# Patient Record
Sex: Female | Born: 1990 | Race: White | Hispanic: No | Marital: Single | Smoking: Former smoker
Health system: Southern US, Community
[De-identification: ages and names within clinical notes are randomized; demographics above are authoritative.]

## PROBLEM LIST (undated history)

## (undated) DIAGNOSIS — F419 Anxiety disorder, unspecified: Secondary | ICD-10-CM

## (undated) DIAGNOSIS — A6920 Lyme disease, unspecified: Secondary | ICD-10-CM

## (undated) DIAGNOSIS — F329 Major depressive disorder, single episode, unspecified: Secondary | ICD-10-CM

## (undated) DIAGNOSIS — F32A Depression, unspecified: Secondary | ICD-10-CM

## (undated) HISTORY — DX: Depression, unspecified: F32.A

## (undated) HISTORY — DX: Anxiety disorder, unspecified: F41.9

## (undated) HISTORY — PX: WISDOM TOOTH EXTRACTION: SHX21

## (undated) HISTORY — DX: Major depressive disorder, single episode, unspecified: F32.9

---

## 2010-09-01 ENCOUNTER — Emergency Department: Payer: Self-pay | Admitting: Emergency Medicine

## 2011-01-15 ENCOUNTER — Ambulatory Visit (INDEPENDENT_AMBULATORY_CARE_PROVIDER_SITE_OTHER): Payer: Medicaid Other | Admitting: Family Medicine

## 2011-01-15 ENCOUNTER — Encounter: Payer: Self-pay | Admitting: Gynecology

## 2011-01-15 VITALS — BP 93/54 | Wt 91.0 lb

## 2011-01-15 DIAGNOSIS — Z3480 Encounter for supervision of other normal pregnancy, unspecified trimester: Secondary | ICD-10-CM

## 2011-01-15 DIAGNOSIS — Z762 Encounter for health supervision and care of other healthy infant and child: Secondary | ICD-10-CM

## 2011-01-16 LAB — OBSTETRIC PANEL
Antibody Screen: NEGATIVE
Basophils Absolute: 0 10*3/uL (ref 0.0–0.1)
Basophils Relative: 1 % (ref 0–1)
Eosinophils Absolute: 0.1 10*3/uL (ref 0.0–0.7)
Eosinophils Relative: 1 % (ref 0–5)
HCT: 39.9 % (ref 36.0–46.0)
Hemoglobin: 13 g/dL (ref 12.0–15.0)
Hepatitis B Surface Ag: NEGATIVE
Lymphocytes Relative: 25 % (ref 12–46)
Lymphs Abs: 2 10*3/uL (ref 0.7–4.0)
MCH: 30.2 pg (ref 26.0–34.0)
MCHC: 32.6 g/dL (ref 30.0–36.0)
MCV: 92.8 fL (ref 78.0–100.0)
Monocytes Absolute: 0.5 10*3/uL (ref 0.1–1.0)
Monocytes Relative: 7 % (ref 3–12)
Neutro Abs: 5.4 10*3/uL (ref 1.7–7.7)
Neutrophils Relative %: 67 % (ref 43–77)
Platelets: 325 10*3/uL (ref 150–400)
RBC: 4.3 MIL/uL (ref 3.87–5.11)
RDW: 13.6 % (ref 11.5–15.5)
Rh Type: POSITIVE
Rubella: >500 IU/mL — ABNORMAL HIGH
WBC: 8.1 10*3/uL (ref 4.0–10.5)

## 2011-01-16 LAB — GC/CHLAMYDIA PROBE AMP, URINE
Chlamydia, Swab/Urine, PCR: NEGATIVE
GC Probe Amp, Urine: NEGATIVE

## 2011-01-16 LAB — HEPATITIS B SURFACE ANTIBODY,QUALITATIVE: Hep B S Ab: POSITIVE — AB

## 2011-01-17 LAB — URINE CULTURE
Colony Count: NO GROWTH
Organism ID, Bacteria: NO GROWTH

## 2011-01-30 ENCOUNTER — Ambulatory Visit (HOSPITAL_COMMUNITY)
Admission: RE | Admit: 2011-01-30 | Discharge: 2011-01-30 | Disposition: A | Discharge status: Home / Self Care | Payer: Medicaid Other | Source: Ambulatory Visit | Attending: Obstetrics and Gynecology | Admitting: Obstetrics and Gynecology

## 2011-01-30 DIAGNOSIS — Z3689 Encounter for other specified antenatal screening: Secondary | ICD-10-CM | POA: Insufficient documentation

## 2011-01-30 DIAGNOSIS — Z762 Encounter for health supervision and care of other healthy infant and child: Secondary | ICD-10-CM

## 2011-01-30 DIAGNOSIS — O99891 Other specified diseases and conditions complicating pregnancy: Secondary | ICD-10-CM | POA: Insufficient documentation

## 2011-01-30 DIAGNOSIS — R109 Unspecified abdominal pain: Secondary | ICD-10-CM | POA: Insufficient documentation

## 2011-02-26 ENCOUNTER — Encounter: Payer: Self-pay | Admitting: Obstetrics & Gynecology

## 2011-02-26 DIAGNOSIS — Z3480 Encounter for supervision of other normal pregnancy, unspecified trimester: Secondary | ICD-10-CM

## 2011-03-20 ENCOUNTER — Emergency Department: Payer: Self-pay | Admitting: *Deleted

## 2011-03-30 ENCOUNTER — Emergency Department: Payer: Self-pay

## 2011-09-01 ENCOUNTER — Observation Stay: Payer: Self-pay

## 2011-09-01 LAB — URINALYSIS, COMPLETE
Bacteria: NONE SEEN
Bilirubin,UR: NEGATIVE
Blood: NEGATIVE
Glucose,UR: 100 mg/dL (ref 0–75)
Ketone: NEGATIVE
Leukocyte Esterase: NEGATIVE
Nitrite: NEGATIVE
Ph: 7 (ref 4.5–8.0)
Protein: NEGATIVE
RBC,UR: NONE SEEN /HPF (ref 0–5)
Specific Gravity: 1.005 (ref 1.003–1.030)
Squamous Epithelial: <1
WBC UR: <1 /HPF (ref 0–5)

## 2011-09-13 ENCOUNTER — Inpatient Hospital Stay: Payer: Self-pay | Admitting: Obstetrics and Gynecology

## 2011-09-13 LAB — CBC WITH DIFFERENTIAL/PLATELET
Basophil #: 0.1 10*3/uL (ref 0.0–0.1)
Basophil %: 0.4 %
Eosinophil #: 0 10*3/uL (ref 0.0–0.7)
Eosinophil %: 0.3 %
HCT: 34.1 % — ABNORMAL LOW (ref 35.0–47.0)
HGB: 11.2 g/dL — ABNORMAL LOW (ref 12.0–16.0)
Lymphocyte #: 1.9 10*3/uL (ref 1.0–3.6)
Lymphocyte %: 11.5 %
MCH: 27.6 pg (ref 26.0–34.0)
MCHC: 32.9 g/dL (ref 32.0–36.0)
MCV: 84 fL (ref 80–100)
Monocyte #: 0.8 x10 3/mm (ref 0.2–0.9)
Monocyte %: 5.2 %
Neutrophil #: 13.3 10*3/uL — ABNORMAL HIGH (ref 1.4–6.5)
Neutrophil %: 82.6 %
Platelet: 240 10*3/uL (ref 150–440)
RBC: 4.06 10*6/uL (ref 3.80–5.20)
RDW: 16.6 % — ABNORMAL HIGH (ref 11.5–14.5)
WBC: 16.1 10*3/uL — ABNORMAL HIGH (ref 3.6–11.0)

## 2011-09-15 LAB — HEMATOCRIT: HCT: 33.4 % — ABNORMAL LOW (ref 35.0–47.0)

## 2014-03-08 ENCOUNTER — Encounter: Payer: Self-pay | Admitting: Gynecology

## 2014-09-14 NOTE — H&P (Signed)
L&D Evaluation:  History:   HPI 24 year old [id number] presents to L&D at 38 weeks 4 days with c/o cramping/back pain. EDD 09/11/11, [facility] and normal anatomy scan.  No problems during pregnancy so far.  Pt main complaints are increased pressure "down low" in her abd, some back discomfort, and having trouble voiding due to pressure from baby. Good FM, no VB, no LOF.    Presents with abdominal pain    Patient's Medical History No Chronic Illness    Patient's Surgical History none    Medications Pre Natal Vitamins    Allergies NKDA    Social History none    Family History Non-Contributory   ROS:   ROS see above   Exam:   Vital Signs stable    Urine Protein not completed    General no apparent distress    Mental Status clear    Chest clear    Abdomen gravid, non-tender    Estimated Fetal Weight Average for gestational age    Edema no edema    Pelvic no external lesions, 1cm/thick per RN    Mebranes Intact    FHT normal rate with no decels    Ucx absent    Skin dry   Impression:   Impression reactive NST, other, 38 4/7 week IUP, normal discomforts of pregnancy (abd cramping/back pain)   Plan:   Plan UA, EFM/NST, monitor contractions and for cervical change, discharge    Comments UA negative, follow up in office as scheduled labor prec given. NST reactive   Electronic Signatures: Shella Maximutnam, Dario Yono (CNM)  (Signed 27-Apr-13 15:37)  Authored: L&D Evaluation   Last Updated: 27-Apr-13 15:37 by Shella MaximPutnam, Latoia Eyster (CNM)

## 2016-05-15 ENCOUNTER — Emergency Department (HOSPITAL_COMMUNITY): Payer: PRIVATE HEALTH INSURANCE

## 2016-05-15 ENCOUNTER — Encounter (HOSPITAL_COMMUNITY): Payer: Self-pay | Admitting: Emergency Medicine

## 2016-05-15 ENCOUNTER — Emergency Department (HOSPITAL_COMMUNITY)
Admission: EM | Admit: 2016-05-15 | Discharge: 2016-05-15 | Disposition: A | Discharge status: Home / Self Care | Payer: PRIVATE HEALTH INSURANCE | Attending: Emergency Medicine | Admitting: Emergency Medicine

## 2016-05-15 DIAGNOSIS — S4991XA Unspecified injury of right shoulder and upper arm, initial encounter: Secondary | ICD-10-CM | POA: Diagnosis present

## 2016-05-15 DIAGNOSIS — Y990 Civilian activity done for income or pay: Secondary | ICD-10-CM | POA: Diagnosis not present

## 2016-05-15 DIAGNOSIS — X500XXA Overexertion from strenuous movement or load, initial encounter: Secondary | ICD-10-CM | POA: Diagnosis not present

## 2016-05-15 DIAGNOSIS — Z87891 Personal history of nicotine dependence: Secondary | ICD-10-CM | POA: Diagnosis not present

## 2016-05-15 DIAGNOSIS — Y929 Unspecified place or not applicable: Secondary | ICD-10-CM | POA: Insufficient documentation

## 2016-05-15 DIAGNOSIS — S43401A Unspecified sprain of right shoulder joint, initial encounter: Secondary | ICD-10-CM | POA: Diagnosis not present

## 2016-05-15 DIAGNOSIS — Y9389 Activity, other specified: Secondary | ICD-10-CM | POA: Insufficient documentation

## 2016-05-15 MED ORDER — OXYCODONE-ACETAMINOPHEN 5-325 MG PO TABS
1.0000 | ORAL_TABLET | Freq: Once | ORAL | Status: AC
  Administered 2016-05-15: 1 via ORAL

## 2016-05-15 MED ORDER — LIDOCAINE 4 % EX CREA: 1.0000 "application " | TOPICAL_CREAM | Freq: Three times a day (TID) | CUTANEOUS | 0 refills | Status: DC | PRN

## 2016-05-15 MED ORDER — METHOCARBAMOL 500 MG PO TABS: ORAL_TABLET | ORAL | 0 refills | Status: DC

## 2016-05-15 MED ORDER — OXYCODONE-ACETAMINOPHEN 5-325 MG PO TABS
ORAL_TABLET | ORAL | Status: DC
Start: 2016-05-15 — End: 2016-05-15
  Filled 2016-05-15: qty 1

## 2016-05-15 NOTE — ED Provider Notes (Signed)
[facility]-EMERGENCY DEPT Provider Note   CSN: [id number] Arrival date & time: 05/15/16  0444     History   Chief Complaint Chief Complaint  Patient presents with  . Shoulder Pain     HPI   Blood pressure 117/72, pulse 86, temperature 98.4 F (36.9 C), temperature source Oral, resp. rate 18, height 4\' 11"  (1.499 m), weight 48.5 kg, SpO2 100 %, unknown if currently breastfeeding.  Sue Shepherd is a 26 y.o. female complaining of right posterior shoulder pain radiating down the back after she was lifting a bag of laundry over her head. Patient is left-hand dominant, there was no direct trauma. She denies weakness, numbness. She states that her pain is exacerbated by movement and palpation.  History reviewed. No pertinent past medical history.  There are no active problems to display for this patient.   History reviewed. No pertinent surgical history.  OB History    Gravida Para Term Preterm AB Living   2       1     SAB TAB Ectopic Multiple Live Births   1               Home Medications    Prior to Admission medications   Medication Sig Start Date End Date Taking? Authorizing Provider  lidocaine (LMX) 4 % cream Apply 1 application topically 3 (three) times daily as needed. 05/15/16   Dianelys Scinto, PA-C  methocarbamol (ROBAXIN) 500 MG tablet Can take up to 1-2 tabs every 6 hours PRN PAIN 05/15/16   Wynetta Emery, PA-C  Prenatal Vit-Fe Fumarate-FA (PRENATAL MULTIVITAMIN) 60-1 MG tablet Take 1 tablet by mouth daily.      Historical Provider, MD    Family History Family History  Problem Relation Age of Onset  . Cancer Paternal Aunt     lung  . Arthritis Paternal Grandfather   . Depression Paternal Grandfather   . Anxiety disorder Paternal Grandfather     Social History Social History  Substance Use Topics  . Smoking status: Former Smoker    Types: Cigarettes    Quit date: 01/05/2011  . Smokeless tobacco: Never Used  . Alcohol use No     Allergies   Patient  has no known allergies.   Review of Systems Review of Systems  10 systems reviewed and found to be negative, except as noted in the HPI.  Physical Exam Updated Vital Signs BP 117/72 (BP Location: Left Arm)   Pulse 86   Temp 98.4 F (36.9 C) (Oral)   Resp 18   Ht 4\' 11"  (1.499 m)   Wt 48.5 kg   SpO2 100%   Breastfeeding? Unknown   BMI 21.61 kg/m   Physical Exam  Constitutional: She is oriented to person, place, and time. She appears well-developed and well-nourished. No distress.  HENT:  Head: Normocephalic and atraumatic.  Mouth/Throat: Oropharynx is clear and moist.  Eyes: Conjunctivae and EOM are normal. Pupils are equal, round, and reactive to light.  Neck: Normal range of motion.  Cardiovascular: Normal rate, regular rhythm and intact distal pulses.   Pulmonary/Chest: Effort normal and breath sounds normal.  Abdominal: Soft. There is no tenderness.  Musculoskeletal: She exhibits tenderness.       Back:  Pain with abduction of left shoulder however she can abduct greater than 90, drop arm is negative, focally tender along the trapezius as diagrammed. No focal tenderness along the rotator cuff musculature. Distally neurovascularly intact.  Neurological: She is alert and oriented to person, place, and  time.  Skin: She is not diaphoretic.  Psychiatric: She has a normal mood and affect.  Nursing note and vitals reviewed.    ED Treatments / Results  Labs (all labs ordered are listed, but only abnormal results are displayed) Labs Reviewed - No data to display  EKG  EKG Interpretation None       Radiology Dg Shoulder Right  Result Date: 05/15/2016 CLINICAL DATA:  Right shoulder pain when turning the head to the right. Injury tonight at work while handling heavy laundry bag. EXAM: RIGHT SHOULDER - 2+ VIEW COMPARISON:  None. FINDINGS: There is no evidence of fracture or dislocation. There is no evidence of arthropathy or other focal bone abnormality. Soft tissues  are unremarkable. IMPRESSION: Negative. Electronically Signed   By: Burman NievesWilliam  Stevens M.D.   On: 05/15/2016 05:24    Procedures Procedures (including critical care time)  Medications Ordered in ED Medications  oxyCODONE-acetaminophen (PERCOCET/ROXICET) 5-325 MG per tablet 1 tablet (1 tablet Oral Given 05/15/16 0503)     Initial Impression / Assessment and Plan / ED Course  I have reviewed the triage vital signs and the nursing notes.  Pertinent labs & imaging results that were available during my care of the patient were reviewed by me and considered in my medical decision making (see chart for details).  Clinical Course     Vitals:   05/15/16 0456 05/15/16 0457  BP: 117/72   Pulse: 86   Resp: 18   Temp: 98.4 F (36.9 C)   TempSrc: Oral   SpO2: 100%   Weight:  48.5 kg  Height:  4\' 11"  (1.499 m)    Medications  oxyCODONE-acetaminophen (PERCOCET/ROXICET) 5-325 MG per tablet 1 tablet (1 tablet Oral Given 05/15/16 0503)    Sue Shepherd is 26 y.o. female presenting with Pain to left trapezius after lifting a heavy bag of soiled linens over her head. Good range of motion, she's not focally tender along the rotator cuff. Because her drop arm is normal. X-ray negative. Patient is offered Lidoderm ointment high-dose NSAIDs and orthopedic referral is given when necessary.  Evaluation does not show pathology that would require ongoing emergent intervention or inpatient treatment. Pt is hemodynamically stable and mentating appropriately. Discussed findings and plan with patient/guardian, who agrees with care plan. All questions answered. Return precautions discussed and outpatient follow up given.      Final Clinical Impressions(s) / ED Diagnoses   Final diagnoses:  Sprain of right shoulder, unspecified shoulder sprain type, initial encounter    New Prescriptions New Prescriptions   LIDOCAINE (LMX) 4 % CREAM    Apply 1 application topically 3 (three) times daily as needed.    METHOCARBAMOL (ROBAXIN) 500 MG TABLET    Can take up to 1-2 tabs every 6 hours PRN PAIN     Wynetta Emeryicole Shalane Florendo, PA-C 05/15/16 [id number]    Gilda Creasehristopher J Pollina, MD 05/15/16 [phone]

## 2016-05-15 NOTE — Discharge Instructions (Signed)
For pain control you may take up to 800mg of Motrin (also known as ibuprofen). That is usually 4 over the counter pills,  3 times a day. Take with food to minimize stomach irritation  ° °For breakthrough pain you may take Robaxin. Do not drink alcohol, drive or operate heavy machinery when taking Robaxin. ° °

## 2016-05-15 NOTE — ED Triage Notes (Signed)
Patient injured her right shoulder while lifting a heavy linen bag at work this evening , pain at right shoulder worse with movement .

## 2017-06-27 ENCOUNTER — Other Ambulatory Visit: Payer: Self-pay

## 2017-06-27 ENCOUNTER — Emergency Department
Admission: EM | Admit: 2017-06-27 | Discharge: 2017-06-27 | Disposition: A | Discharge status: Home / Self Care | Payer: Self-pay | Attending: Emergency Medicine | Admitting: Emergency Medicine

## 2017-06-27 ENCOUNTER — Emergency Department: Payer: Self-pay

## 2017-06-27 ENCOUNTER — Encounter: Payer: Self-pay | Admitting: Emergency Medicine

## 2017-06-27 DIAGNOSIS — Z3202 Encounter for pregnancy test, result negative: Secondary | ICD-10-CM | POA: Insufficient documentation

## 2017-06-27 DIAGNOSIS — F17210 Nicotine dependence, cigarettes, uncomplicated: Secondary | ICD-10-CM | POA: Insufficient documentation

## 2017-06-27 DIAGNOSIS — R1012 Left upper quadrant pain: Secondary | ICD-10-CM

## 2017-06-27 DIAGNOSIS — K5901 Slow transit constipation: Secondary | ICD-10-CM | POA: Insufficient documentation

## 2017-06-27 LAB — COMPREHENSIVE METABOLIC PANEL
ALT: 23 U/L (ref 14–54)
AST: 28 U/L (ref 15–41)
Albumin: 4.6 g/dL (ref 3.5–5.0)
Alkaline Phosphatase: 75 U/L (ref 38–126)
Anion gap: 10 (ref 5–15)
BUN: 10 mg/dL (ref 6–20)
CO2: 21 mmol/L — ABNORMAL LOW (ref 22–32)
Calcium: 9.3 mg/dL (ref 8.9–10.3)
Chloride: 106 mmol/L (ref 101–111)
Creatinine, Ser: 0.78 mg/dL (ref 0.44–1.00)
GFR calc Af Amer: >60 mL/min (ref >60)
GFR calc non Af Amer: >60 mL/min (ref >60)
Glucose, Bld: 101 mg/dL — ABNORMAL HIGH (ref 65–99)
Potassium: 3.7 mmol/L (ref 3.5–5.1)
Sodium: 137 mmol/L (ref 135–145)
Total Bilirubin: 1.6 mg/dL — ABNORMAL HIGH (ref 0.3–1.2)
Total Protein: 7.9 g/dL (ref 6.5–8.1)

## 2017-06-27 LAB — CBC
HCT: 45.5 % (ref 35.0–47.0)
Hemoglobin: 15.5 g/dL (ref 12.0–16.0)
MCH: 32.2 pg (ref 26.0–34.0)
MCHC: 34.1 g/dL (ref 32.0–36.0)
MCV: 94.4 fL (ref 80.0–100.0)
Platelets: 304 10*3/uL (ref 150–440)
RBC: 4.82 MIL/uL (ref 3.80–5.20)
RDW: 13.4 % (ref 11.5–14.5)
WBC: 8.3 10*3/uL (ref 3.6–11.0)

## 2017-06-27 LAB — POCT PREGNANCY, URINE: Preg Test, Ur: NEGATIVE

## 2017-06-27 LAB — URINALYSIS, COMPLETE (UACMP) WITH MICROSCOPIC
Bacteria, UA: NONE SEEN
Bilirubin Urine: NEGATIVE
Glucose, UA: NEGATIVE mg/dL
Hgb urine dipstick: NEGATIVE
Ketones, ur: NEGATIVE mg/dL
Leukocytes, UA: NEGATIVE
Nitrite: NEGATIVE
Protein, ur: NEGATIVE mg/dL
Specific Gravity, Urine: 1.009 (ref 1.005–1.030)
pH: 6 (ref 5.0–8.0)

## 2017-06-27 LAB — LIPASE, BLOOD: Lipase: 24 U/L (ref 11–51)

## 2017-06-27 MED ORDER — POLYETHYLENE GLYCOL 3350 17 G PO PACK: 17.0000 g | PACK | Freq: Every day | ORAL | 0 refills | Status: DC

## 2017-06-27 NOTE — ED Notes (Signed)
Patient ambulatory to lobby with steady gait and NAD noted. Verbalized understanding of discharge instructions and follow-up care.  

## 2017-06-27 NOTE — ED Triage Notes (Signed)
Left ABD pain x 4 days with fever and loose stools. Denies problems with urination.

## 2017-06-27 NOTE — ED Triage Notes (Signed)
First Nurse Note:  C/o left flank pain x 2-3 days.  Patient is AAOx3.  Skin warm and dry. NAD

## 2017-06-27 NOTE — ED Provider Notes (Signed)
[facility] Emergency Department Provider Note   ____________________________________________    I have reviewed the triage vital signs and the nursing notes.   HISTORY  Chief Complaint Abdominal Pain     HPI Sue Shepherd is a 27 y.o. female who presents with complaints of left upper quadrant abdominal pain.  Patient reports approximately 4 days ago she initially developed a fever of 101.5, a day or 2 prior to that she had developed a dry cough.  One day after developing the fever she developed some pain in her left upper quadrant and also developed loose stools.  Occasional nausea but not severe.  No history of abdominal surgery.  Took ibuprofen which helped her fever but not the pain.  She reports the pain is moderate and sharp and constant in the left upper quadrant.  No radiation.  No history of kidney stones.  No dysuria.   No past medical history on file.  There are no active problems to display for this patient.   No past surgical history on file.  Prior to Admission medications   Medication Sig Start Date End Date Taking? Authorizing Provider  lidocaine (LMX) 4 % cream Apply 1 application topically 3 (three) times daily as needed. Patient not taking: Reported on 06/27/2017 05/15/16   Pisciotta, Joni Reining, PA-C  methocarbamol (ROBAXIN) 500 MG tablet Can take up to 1-2 tabs every 6 hours PRN PAIN Patient not taking: Reported on 06/27/2017 05/15/16   Pisciotta, Joni Reining, PA-C  polyethylene glycol (MIRALAX / GLYCOLAX) packet Take 17 g by mouth daily. 06/27/17   Jene Every, MD     Allergies Patient has no known allergies.  Family History  Problem Relation Age of Onset  . Cancer Paternal Aunt        lung  . Arthritis Paternal Grandfather   . Depression Paternal Grandfather   . Anxiety disorder Paternal Grandfather     Social History Social History   Tobacco Use  . Smoking status: Current Every Day Smoker    Types: Cigarettes  . Smokeless  tobacco: Never Used  Substance Use Topics  . Alcohol use: Yes  . Drug use: No    Review of Systems  Constitutional: Fever Eyes: No visual changes.  ENT: No sore throat. Cardiovascular: Denies chest pain. Respiratory: Denies shortness of breath. Gastrointestinal: No abdominal pain.  Diarrhea, occasional nausea Genitourinary: Negative for dysuria. Musculoskeletal: Negative for back pain. Skin: Negative for rash. Neurological: Negative for headaches    ____________________________________________   PHYSICAL EXAM:  VITAL SIGNS: ED Triage Vitals  Enc Vitals Group     BP 06/27/17 0828 106/68     Pulse Rate 06/27/17 0828 83     Resp --      Temp 06/27/17 0828 98.4 F (36.9 C)     Temp Source 06/27/17 0828 Oral     SpO2 06/27/17 0828 98 %     Weight 06/27/17 0830 43.5 kg (96 lb)     Height 06/27/17 0830 1.499 m (4\' 11" )     Head Circumference --      Peak Flow --      Pain Score 06/27/17 0829 8     Pain Loc --      Pain Edu? --      Excl. in GC? --     Constitutional: Alert and oriented. No acute distress. Pleasant and interactive Eyes: Conjunctivae are normal.   Nose: No congestion/rhinnorhea. Mouth/Throat: Mucous membranes are moist.  Pharynx normal Neck:  Painless ROM  Cardiovascular: Normal rate, regular rhythm.   Good peripheral circulation. Respiratory: Normal respiratory effort.  No retractions. Lungs CTAB. Gastrointestinal: Mild tenderness to palpation left upper quadrant.  Unable to palpate spleen. No distention.  No CVA tenderness. Genitourinary: deferred Musculoskeletal:   Warm and well perfused Neurologic:  Normal speech and language. No gross focal neurologic deficits are appreciated.  Skin:  Skin is warm, dry and intact. No rash noted. Psychiatric: Mood and affect are normal. Speech and behavior are normal.  ____________________________________________   LABS (all labs ordered are listed, but only abnormal results are displayed)  Labs Reviewed    URINALYSIS, COMPLETE (UACMP) WITH MICROSCOPIC - Abnormal; Notable for the following components:      Result Value   Color, Urine YELLOW (*)    APPearance HAZY (*)    Squamous Epithelial / LPF 6-30 (*)    All other components within normal limits  COMPREHENSIVE METABOLIC PANEL - Abnormal; Notable for the following components:   CO2 21 (*)    Glucose, Bld 101 (*)    Total Bilirubin 1.6 (*)    All other components within normal limits  CBC  LIPASE, BLOOD  POCT PREGNANCY, URINE   ____________________________________________  EKG  None ____________________________________________  RADIOLOGY  CT scan demonstrates ascending colon constipation ____________________________________________   PROCEDURES  Procedure(s) performed: No  Procedures   Critical Care performed: No ____________________________________________   INITIAL IMPRESSION / ASSESSMENT AND PLAN / ED COURSE  Pertinent labs & imaging results that were available during my care of the patient were reviewed by me and considered in my medical decision making (see chart for details).  Patient overall well-appearing in no acute distress.  Vital signs normal.  Exam demonstrates mild left upper quadrant tenderness.  Differential diagnosis includes colitis, viral illness, less likely gastritis, ureterolithiasis, UTI  We will check labs including urinalysis and reevaluate.  Labs and urinalysis are reassuring.  CT scan demonstrates only constipation, no kidney stones.  Suspect constipation is likely playing a role in her discomfort.  Discussed high-fiber diet, will start on MiraLAX, outpatient follow-up or return to ED if any worsening pain    ____________________________________________   FINAL CLINICAL IMPRESSION(S) / ED DIAGNOSES  Final diagnoses:  Slow transit constipation  Left upper quadrant pain        Note:  This document was prepared using Dragon voice recognition software and may include  unintentional dictation errors.    [name], Daiki Dicostanzo, MD 06/27/17 [phone]

## 2017-07-01 ENCOUNTER — Other Ambulatory Visit: Payer: Self-pay

## 2017-07-01 ENCOUNTER — Emergency Department: Payer: Self-pay

## 2017-07-01 ENCOUNTER — Emergency Department
Admission: EM | Admit: 2017-07-01 | Discharge: 2017-07-01 | Disposition: A | Discharge status: Home / Self Care | Payer: Self-pay | Attending: Student in an Organized Health Care Education/Training Program | Admitting: Student in an Organized Health Care Education/Training Program

## 2017-07-01 ENCOUNTER — Encounter: Payer: Self-pay | Admitting: Emergency Medicine

## 2017-07-01 DIAGNOSIS — Z79899 Other long term (current) drug therapy: Secondary | ICD-10-CM | POA: Insufficient documentation

## 2017-07-01 DIAGNOSIS — R1084 Generalized abdominal pain: Secondary | ICD-10-CM | POA: Insufficient documentation

## 2017-07-01 DIAGNOSIS — F17210 Nicotine dependence, cigarettes, uncomplicated: Secondary | ICD-10-CM | POA: Insufficient documentation

## 2017-07-01 DIAGNOSIS — R197 Diarrhea, unspecified: Secondary | ICD-10-CM | POA: Insufficient documentation

## 2017-07-01 LAB — COMPREHENSIVE METABOLIC PANEL
ALT: 17 U/L (ref 14–54)
AST: 24 U/L (ref 15–41)
Albumin: 4.3 g/dL (ref 3.5–5.0)
Alkaline Phosphatase: 80 U/L (ref 38–126)
Anion gap: 6 (ref 5–15)
BUN: 10 mg/dL (ref 6–20)
CO2: 24 mmol/L (ref 22–32)
Calcium: 8.6 mg/dL — ABNORMAL LOW (ref 8.9–10.3)
Chloride: 104 mmol/L (ref 101–111)
Creatinine, Ser: 0.68 mg/dL (ref 0.44–1.00)
GFR calc Af Amer: >60 mL/min (ref >60)
GFR calc non Af Amer: >60 mL/min (ref >60)
Glucose, Bld: 124 mg/dL — ABNORMAL HIGH (ref 65–99)
Potassium: 3.4 mmol/L — ABNORMAL LOW (ref 3.5–5.1)
Sodium: 134 mmol/L — ABNORMAL LOW (ref 135–145)
Total Bilirubin: 1.4 mg/dL — ABNORMAL HIGH (ref 0.3–1.2)
Total Protein: 7.3 g/dL (ref 6.5–8.1)

## 2017-07-01 LAB — CBC
HCT: 42 % (ref 35.0–47.0)
Hemoglobin: 14.2 g/dL (ref 12.0–16.0)
MCH: 32.3 pg (ref 26.0–34.0)
MCHC: 33.8 g/dL (ref 32.0–36.0)
MCV: 95.4 fL (ref 80.0–100.0)
Platelets: 274 10*3/uL (ref 150–440)
RBC: 4.4 MIL/uL (ref 3.80–5.20)
RDW: 13.3 % (ref 11.5–14.5)
WBC: 11.7 10*3/uL — ABNORMAL HIGH (ref 3.6–11.0)

## 2017-07-01 LAB — URINALYSIS, COMPLETE (UACMP) WITH MICROSCOPIC
Bacteria, UA: NONE SEEN
Bilirubin Urine: NEGATIVE
Glucose, UA: NEGATIVE mg/dL
Ketones, ur: NEGATIVE mg/dL
Leukocytes, UA: NEGATIVE
Nitrite: NEGATIVE
Protein, ur: NEGATIVE mg/dL
Specific Gravity, Urine: 1.021 (ref 1.005–1.030)
pH: 5 (ref 5.0–8.0)

## 2017-07-01 LAB — LIPASE, BLOOD: Lipase: 27 U/L (ref 11–51)

## 2017-07-01 LAB — POCT PREGNANCY, URINE: Preg Test, Ur: NEGATIVE

## 2017-07-01 MED ORDER — PROMETHAZINE HCL 25 MG/ML IJ SOLN
12.5000 mg | Freq: Four times a day (QID) | INTRAMUSCULAR | Status: DC | PRN
  Administered 2017-07-01: 12.5 mg via INTRAVENOUS
  Filled 2017-07-01: qty 1

## 2017-07-01 MED ORDER — MORPHINE SULFATE (PF) 4 MG/ML IV SOLN
4.0000 mg | INTRAVENOUS | Status: DC | PRN
  Administered 2017-07-01: 4 mg via INTRAVENOUS
  Filled 2017-07-01: qty 1

## 2017-07-01 MED ORDER — DICYCLOMINE HCL 10 MG PO CAPS: 10.0000 mg | ORAL_CAPSULE | Freq: Three times a day (TID) | ORAL | 0 refills | Status: DC | PRN

## 2017-07-01 MED ORDER — SODIUM CHLORIDE 0.9 % IV BOLUS (SEPSIS)
1000.0000 mL | Freq: Once | INTRAVENOUS | Status: AC
  Administered 2017-07-01: 1000 mL via INTRAVENOUS

## 2017-07-01 MED ORDER — PROMETHAZINE HCL 12.5 MG PO TABS: 12.5000 mg | ORAL_TABLET | Freq: Four times a day (QID) | ORAL | 0 refills | Status: DC | PRN

## 2017-07-01 MED ORDER — IOPAMIDOL (ISOVUE-300) INJECTION 61%
65.0000 mL | Freq: Once | INTRAVENOUS | Status: AC | PRN
  Administered 2017-07-01: 65 mL via INTRAVENOUS
  Filled 2017-07-01: qty 75

## 2017-07-01 NOTE — Discharge Instructions (Signed)

## 2017-07-01 NOTE — ED Triage Notes (Signed)
Abdominal pain x 1 week. Originally L side, now all over.

## 2017-07-01 NOTE — ED Provider Notes (Signed)
[facility] Emergency Department Provider Note    First MD Initiated Contact with Patient 07/01/17 1325     (approximate)  I have reviewed the triage vital signs and the nursing notes.   HISTORY  Chief Complaint Abdominal Pain    HPI [name] is a 27 y.o. female presents with chief complaint of generalized abdominal pain associated with bloody diarrhea and some nausea.  No measured fevers.  Does have family history of ulcerative colitis and Crohn's.  Patient was seen for similar symptoms roughly 1 week ago but has had worsening symptoms since then.  States the pain is crampy and colicky in nature.  Mild to moderate in severity.  Said decreased oral intake.  Has never had symptoms like this before.  History reviewed. No pertinent past medical history. Family History  Problem Relation Age of Onset  . Cancer Paternal Aunt        lung  . Arthritis Paternal Grandfather   . Depression Paternal Grandfather   . Anxiety disorder Paternal Grandfather    History reviewed. No pertinent surgical history. There are no active problems to display for this patient.     Prior to Admission medications   Medication Sig Start Date End Date Taking? Authorizing Provider  lidocaine (LMX) 4 % cream Apply 1 application topically 3 (three) times daily as needed. Patient not taking: Reported on 06/27/2017 05/15/16   Pisciotta, Joni ReiningNicole, PA-C  methocarbamol (ROBAXIN) 500 MG tablet Can take up to 1-2 tabs every 6 hours PRN PAIN Patient not taking: Reported on 06/27/2017 05/15/16   Pisciotta, Joni ReiningNicole, PA-C  polyethylene glycol (MIRALAX / GLYCOLAX) packet Take 17 g by mouth daily. 06/27/17   [name], Robert, MD    Allergies Patient has no known allergies.    Social History Social History   Tobacco Use  . Smoking status: Current Every Day Smoker    Types: Cigarettes  . Smokeless tobacco: Never Used  Substance Use Topics  . Alcohol use: Yes  . Drug use: No    Review of  Systems Patient denies headaches, rhinorrhea, blurry vision, numbness, shortness of breath, chest pain, edema, cough, abdominal pain, nausea, vomiting, diarrhea, dysuria, fevers, rashes or hallucinations unless otherwise stated above in HPI. ____________________________________________   PHYSICAL EXAM:  VITAL SIGNS: Vitals:   07/01/17 1232  BP: 113/69  Pulse: 85  Resp: 18  Temp: 98.1 F (36.7 C)  SpO2: 100%    Constitutional: Alert and oriented. Well appearing and in no acute distress. Eyes: Conjunctivae are normal.  Head: Atraumatic. Nose: No congestion/rhinnorhea. Mouth/Throat: Mucous membranes are moist.   Neck: No stridor. Painless ROM.  Cardiovascular: Normal rate, regular rhythm. Grossly normal heart sounds.  Good peripheral circulation. Respiratory: Normal respiratory effort.  No retractions. Lungs CTAB. Gastrointestinal: Soft and nontender. No distention. No abdominal bruits. No CVA tenderness. Genitourinary:  Musculoskeletal: No lower extremity tenderness nor edema.  No joint effusions. Neurologic:  Normal speech and language. No gross focal neurologic deficits are appreciated. No facial droop Skin:  Skin is warm, dry and intact. No rash noted. Psychiatric: Mood and affect are normal. Speech and behavior are normal.  ____________________________________________   LABS (all labs ordered are listed, but only abnormal results are displayed)  Results for orders placed or performed during the hospital encounter of 07/01/17 (from the past 24 hour(s))  Urinalysis, Complete w Microscopic     Status: Abnormal   Collection Time: 07/01/17 12:34 PM  Result Value Ref Range   Color, Urine YELLOW (A) YELLOW  APPearance HAZY (A) CLEAR   Specific Gravity, Urine 1.021 1.005 - 1.030   pH 5.0 5.0 - 8.0   Glucose, UA NEGATIVE NEGATIVE mg/dL   Hgb urine dipstick MODERATE (A) NEGATIVE   Bilirubin Urine NEGATIVE NEGATIVE   Ketones, ur NEGATIVE NEGATIVE mg/dL   Protein, ur NEGATIVE  NEGATIVE mg/dL   Nitrite NEGATIVE NEGATIVE   Leukocytes, UA NEGATIVE NEGATIVE   RBC / HPF 0-5 0 - 5 RBC/hpf   WBC, UA 0-5 0 - 5 WBC/hpf   Bacteria, UA NONE SEEN NONE SEEN   Squamous Epithelial / LPF 0-5 (A) NONE SEEN   Mucus PRESENT   Pregnancy, urine POC     Status: None   Collection Time: 07/01/17 12:39 PM  Result Value Ref Range   Preg Test, Ur NEGATIVE NEGATIVE  Lipase, blood     Status: None   Collection Time: 07/01/17 12:42 PM  Result Value Ref Range   Lipase 27 11 - 51 U/L  Comprehensive metabolic panel     Status: Abnormal   Collection Time: 07/01/17 12:42 PM  Result Value Ref Range   Sodium 134 (L) 135 - 145 mmol/L   Potassium 3.4 (L) 3.5 - 5.1 mmol/L   Chloride 104 101 - 111 mmol/L   CO2 24 22 - 32 mmol/L   Glucose, Bld 124 (H) 65 - 99 mg/dL   BUN 10 6 - 20 mg/dL   Creatinine, Ser 1.61 0.44 - 1.00 mg/dL   Calcium 8.6 (L) 8.9 - 10.3 mg/dL   Total Protein 7.3 6.5 - 8.1 g/dL   Albumin 4.3 3.5 - 5.0 g/dL   AST 24 15 - 41 U/L   ALT 17 14 - 54 U/L   Alkaline Phosphatase 80 38 - 126 U/L   Total Bilirubin 1.4 (H) 0.3 - 1.2 mg/dL   GFR calc non Af Amer >60 >60 mL/min   GFR calc Af Amer >60 >60 mL/min   Anion gap 6 5 - 15  CBC     Status: Abnormal   Collection Time: 07/01/17 12:42 PM  Result Value Ref Range   WBC 11.7 (H) 3.6 - 11.0 K/uL   RBC 4.40 3.80 - 5.20 MIL/uL   Hemoglobin 14.2 12.0 - 16.0 g/dL   HCT 09.6 04.5 - 40.9 %   MCV 95.4 80.0 - 100.0 fL   MCH 32.3 26.0 - 34.0 pg   MCHC 33.8 32.0 - 36.0 g/dL   RDW 81.1 91.4 - 78.2 %   Platelets 274 150 - 440 K/uL   ____________________________________________ ____________________________________________  RADIOLOGY  I personally reviewed all radiographic images ordered to evaluate for the above acute complaints and reviewed radiology reports and findings.  These findings were personally discussed with the patient.  Please see medical record for radiology  report.  ____________________________________________   PROCEDURES  Procedure(s) performed:  Procedures    Critical Care performed: no ____________________________________________   INITIAL IMPRESSION / ASSESSMENT AND PLAN / ED COURSE  Pertinent labs & imaging results that were available during my care of the patient were reviewed by me and considered in my medical decision making (see chart for details).  DDX: ibd, appendicitis, colitis, food poisoning, enteritis, gastritis  Denielle Bayard is a 27 y.o. who presents to the ED with dental pain as described above.  Blood work sent for the above differential shows mild leukocytosis.  Given her history and family history of IBD will order CT imaging to evaluate for acute intra-abdominal process.  CT imaging shows no acute abnormality.  Patient was given IV fluids as well as IV pain medication and I emetics.  Symptoms improved and patient tolerating oral hydration.  At this point I do believe patient is appropriate for follow-up as an outpatient.  Have discussed with the patient and available family all diagnostics and treatments performed thus far and all questions were answered to the best of my ability. The patient demonstrates understanding and agreement with plan.       ____________________________________________   FINAL CLINICAL IMPRESSION(S) / ED DIAGNOSES  Final diagnoses:  Generalized abdominal pain      NEW MEDICATIONS STARTED DURING THIS VISIT:  New Prescriptions   No medications on file     Note:  This document was prepared using Dragon voice recognition software and may include unintentional dictation errors.    Willy Eddy, MD 07/01/17 [phone]

## 2017-07-01 NOTE — ED Notes (Signed)
Patient transported to CT 

## 2017-12-03 ENCOUNTER — Encounter: Payer: Self-pay | Admitting: Radiology

## 2017-12-03 ENCOUNTER — Other Ambulatory Visit: Payer: Medicaid Other

## 2017-12-30 NOTE — Progress Notes (Signed)
Last pap 02/10/2012

## 2017-12-31 ENCOUNTER — Ambulatory Visit (INDEPENDENT_AMBULATORY_CARE_PROVIDER_SITE_OTHER): Payer: Medicaid Other | Admitting: Obstetrics and Gynecology

## 2017-12-31 ENCOUNTER — Other Ambulatory Visit (HOSPITAL_COMMUNITY)
Admission: RE | Admit: 2017-12-31 | Discharge: 2017-12-31 | Disposition: A | Discharge status: Home / Self Care | Payer: Medicaid Other | Source: Ambulatory Visit | Attending: Obstetrics and Gynecology | Admitting: Obstetrics and Gynecology

## 2017-12-31 ENCOUNTER — Encounter: Payer: Self-pay | Admitting: Obstetrics and Gynecology

## 2017-12-31 DIAGNOSIS — Z3480 Encounter for supervision of other normal pregnancy, unspecified trimester: Secondary | ICD-10-CM | POA: Insufficient documentation

## 2017-12-31 DIAGNOSIS — Z3A00 Weeks of gestation of pregnancy not specified: Secondary | ICD-10-CM | POA: Diagnosis not present

## 2017-12-31 MED ORDER — DOXYLAMINE-PYRIDOXINE ER 20-20 MG PO TBCR: 1.0000 | EXTENDED_RELEASE_TABLET | Freq: Every day | ORAL | 2 refills | Status: DC

## 2017-12-31 NOTE — Progress Notes (Signed)
New OB Note  12/31/2017   Clinic: [facility] for [facility]  Chief Complaint: NOB  Transfer of Care Patient: no  History of Present Illness: Sue Shepherd is a 27 y.o. G3P1011 @ 9/6 weeks (EDC 3/25, based on Patient's last menstrual period was 10/23/2017 (within days).).  Preg complicated by has Supervision of other normal pregnancy, antepartum on their problem list.   Any events prior to today's visit: no Her periods were: regular, qmonth She was using Depo-Provera when she conceived.  She has Positive signs or symptoms of nausea/vomiting of pregnancy. She has Negative signs or symptoms of miscarriage or preterm labor On any medications around the time she conceived/early pregnancy: No   ROS: A 12-point review of systems was performed and negative, except as stated in the above HPI.  OBGYN History: As per HPI. OB History  Gravida Para Term Preterm AB Living  3 1 1   1 1   SAB TAB Ectopic Multiple Live Births  1       1    # Outcome Date GA Lbr Len/2nd Weight Sex Delivery Anes PTL Lv  3 Current           2 Term 2013   6 lb 11 oz (3.033 kg)  Vag-Spont   LIV  1 SAB 11/03/10 [redacted]w[redacted]d       DEC    Any issues with any prior pregnancies: no Prior children are healthy, doing well, and without any problems or issues: no History of pap smears: Yes. Last pap smear with last child   Past Medical History: Past Medical History:  Diagnosis Date  . Anxiety   . Depression     Past Surgical History: Past Surgical History:  Procedure Laterality Date  . WISDOM TOOTH EXTRACTION      Family History:  Family History  Problem Relation Age of Onset  . Arthritis Paternal Grandfather   . Anxiety disorder Paternal Grandfather   . Heart disease Paternal Grandfather   . Anxiety disorder Sister   . Depression Sister   . Crohn's disease Paternal Uncle   . Diabetes Maternal Grandfather   . Dementia Paternal Grandmother    She denies any history of mental retardation, birth defects or  genetic disorders in her or the FOB's history  Social History:  Social History   Socioeconomic History  . Marital status: Single    Spouse name: Not on file  . Number of children: Not on file  . Years of education: Not on file  . Highest education level: Not on file  Occupational History  . Not on file  Social Needs  . Financial resource strain: Not on file  . Food insecurity:    Worry: Not on file    Inability: Not on file  . Transportation needs:    Medical: Not on file    Non-medical: Not on file  Tobacco Use  . Smoking status: Former Smoker    Types: Cigarettes    Last attempt to quit: 12/04/2017    Years since quitting: 0.0  . Smokeless tobacco: Never Used  Substance and Sexual Activity  . Alcohol use: Not Currently  . Drug use: No  . Sexual activity: Yes    Birth control/protection: None  Lifestyle  . Physical activity:    Days per week: Not on file    Minutes per session: Not on file  . Stress: Not on file  Relationships  . Social connections:    Talks on phone: Not on file  Gets together: Not on file    Attends religious service: Not on file    Active member of club or organization: Not on file    Attends meetings of clubs or organizations: Not on file    Relationship status: Not on file  . Intimate partner violence:    Fear of current or ex partner: Not on file    Emotionally abused: Not on file    Physically abused: Not on file    Forced sexual activity: Not on file  Other Topics Concern  . Not on file  Social History Narrative  . Not on file    Allergy: No Known Allergies  Health Maintenance:  Mammogram Up to Date: not applicable  Current Outpatient Medications: PNV  Physical Exam:   BP 101/66   Pulse 80   Wt 90 lb 6.4 oz (41 kg)   LMP 10/23/2017 (Within Days) Comment: neg preg test 07/01/17  BMI 18.26 kg/m  Body mass index is 18.26 kg/m.    Marland Kitchen. Fundal height: not applicable FHTs: 160s  General appearance: Well nourished, well  developed female in no acute distress.  Neck:  Supple, normal appearance, and no thyromegaly  Cardiovascular: S1, S2 normal, no murmur, rub or gallop, regular rate and rhythm Respiratory:  Clear to auscultation bilateral. Normal respiratory effort Abdomen: positive bowel sounds and no masses, hernias; diffusely non tender to palpation, non distended Breasts: breasts appear normal, no suspicious masses, no skin or nipple changes or axillary nodes, and normal palpation. Neuro/Psych:  Normal mood and affect.  Skin:  Warm and dry.  Lymphatic:  No inguinal lymphadenopathy.   Pelvic exam: is not limited by body habitus EGBUS: within normal limits, Vagina: within normal limits and with no blood in the vault, Cervix: normal appearing cervix without discharge or lesions, closed/long/high, Uterus:  enlarged, c/w 10 week size, and Adnexa:  normal adnexa and no mass, fullness, tenderness  Laboratory: deferred  Imaging:  Bedside u/s: SLIUP, +FM, 9wks 4d, subj normal fluid  Assessment: pt doing well  Plan: 1. Supervision of other normal pregnancy, antepartum Routine care. Will get labs done next visit so can do cffdna Follow weight closely. bonjesta Rx given - Culture, OB Urine - Cytology - PAP  Problem list reviewed and updated.  >50% of 25 min visit spent on counseling and coordination of care.     Sue Shepherd, Jr. MD Attending Center [facility](Faculty Practice)

## 2017-12-31 NOTE — Patient Instructions (Signed)
Today you are 9 weeks and 6 days Your due date is March 25th

## 2018-01-01 LAB — CYTOLOGY - PAP
Chlamydia: NEGATIVE
Diagnosis: NEGATIVE
Neisseria Gonorrhea: NEGATIVE
Trichomonas: NEGATIVE

## 2018-01-02 LAB — CULTURE, OB URINE

## 2018-01-02 LAB — URINE CULTURE, OB REFLEX

## 2018-01-10 ENCOUNTER — Ambulatory Visit (INDEPENDENT_AMBULATORY_CARE_PROVIDER_SITE_OTHER): Payer: Medicaid Other | Admitting: Obstetrics and Gynecology

## 2018-01-10 VITALS — BP 93/64 | HR 92 | Wt 92.2 lb

## 2018-01-10 DIAGNOSIS — Z315 Encounter for genetic counseling: Secondary | ICD-10-CM | POA: Diagnosis not present

## 2018-01-10 DIAGNOSIS — Z3480 Encounter for supervision of other normal pregnancy, unspecified trimester: Secondary | ICD-10-CM

## 2018-01-10 DIAGNOSIS — Z3481 Encounter for supervision of other normal pregnancy, first trimester: Secondary | ICD-10-CM | POA: Diagnosis not present

## 2018-01-10 NOTE — Progress Notes (Signed)
Prenatal Visit Note Date: 01/10/2018 Clinic: [facility]  Subjective:  Sue Shepherd is a 27 y.o. G3P1011 at [redacted]w[redacted]d being seen today for ongoing prenatal care.  She is currently monitored for the following issues for this low-risk pregnancy and has Supervision of other normal pregnancy, antepartum on their problem list.  Patient reports no complaints.   Contractions: Not present. Vag. Bleeding: None.  Movement: Present. Denies leaking of fluid.   The following portions of the patient's history were reviewed and updated as appropriate: allergies, current medications, past family history, past medical history, past social history, past surgical history and problem list. Problem list updated.  Objective:   Vitals:   01/10/18 0818  BP: 93/64  Pulse: 92  Weight: 92 lb 3.2 oz (41.8 kg)    Fetal Status: Fetal Heart Rate (bpm): 160   Movement: Present     General:  Alert, oriented and cooperative. Patient is in no acute distress.  Skin: Skin is warm and dry. No rash noted.   Cardiovascular: Normal heart rate noted  Respiratory: Normal respiratory effort, no problems with respiration noted  Abdomen: Soft, gravid, appropriate for gestational age. Pain/Pressure: Absent     Pelvic:  Cervical exam deferred        Extremities: Normal range of motion.  Edema: None  Mental Status: Normal mood and affect. Normal behavior. Normal judgment and thought content.   Urinalysis:      Assessment and Plan:  Pregnancy: G3P1011 at [redacted]w[redacted]d  1. Supervision of other normal pregnancy, antepartum Minimal s/s of nausea. Weight up 2lbs. NOB labs today. Offer afp and schedule anatomy u/s nv.  - Hemoglobinopathy evaluation  Preterm labor symptoms and general obstetric precautions including but not limited to vaginal bleeding, contractions, leaking of fluid and fetal movement were reviewed in detail with the patient. Please refer to After Visit Summary for other counseling recommendations.   Return in about 4 weeks (around 02/07/2018) for rob.   Grawn Bing, MD

## 2018-01-13 LAB — OBSTETRIC PANEL, INCLUDING HIV
Antibody Screen: NEGATIVE
Basophils Absolute: 0.1 10*3/uL (ref 0.0–0.2)
Basos: 1 %
EOS (ABSOLUTE): 0.1 10*3/uL (ref 0.0–0.4)
Eos: 1 %
HIV Screen 4th Generation wRfx: NONREACTIVE
Hematocrit: 34.3 % (ref 34.0–46.6)
Hemoglobin: 12 g/dL (ref 11.1–15.9)
Hepatitis B Surface Ag: NEGATIVE
Immature Grans (Abs): 0 10*3/uL (ref 0.0–0.1)
Immature Granulocytes: 0 %
Lymphocytes Absolute: 2.1 10*3/uL (ref 0.7–3.1)
Lymphs: 24 %
MCH: 31.1 pg (ref 26.6–33.0)
MCHC: 35 g/dL (ref 31.5–35.7)
MCV: 89 fL (ref 79–97)
Monocytes Absolute: 0.4 10*3/uL (ref 0.1–0.9)
Monocytes: 5 %
Neutrophils Absolute: 6.1 10*3/uL (ref 1.4–7.0)
Neutrophils: 69 %
Platelets: 316 10*3/uL (ref 150–450)
RBC: 3.86 x10E6/uL (ref 3.77–5.28)
RDW: 12.1 % — ABNORMAL LOW (ref 12.3–15.4)
RPR Ser Ql: NONREACTIVE
Rh Factor: POSITIVE
Rubella Antibodies, IGG: 10.4 index (ref >0.99)
WBC: 8.8 10*3/uL (ref 3.4–10.8)

## 2018-01-13 LAB — HEMOGLOBINOPATHY EVALUATION
HGB C: 0 %
HGB S: 0 %
HGB VARIANT: 0 %
Hemoglobin A2 Quantitation: 2.5 % (ref 1.8–3.2)
Hemoglobin F Quantitation: 0 % (ref 0.0–2.0)
Hgb A: 97.5 % (ref 96.4–98.8)

## 2018-01-13 LAB — HEPATITIS C ANTIBODY: Hep C Virus Ab: <0.1 s/co ratio (ref 0.0–0.9)

## 2018-01-16 ENCOUNTER — Encounter: Payer: Self-pay | Admitting: Radiology

## 2018-01-16 DIAGNOSIS — O99611 Diseases of the digestive system complicating pregnancy, first trimester: Secondary | ICD-10-CM | POA: Diagnosis not present

## 2018-01-16 DIAGNOSIS — R079 Chest pain, unspecified: Secondary | ICD-10-CM | POA: Diagnosis not present

## 2018-01-16 DIAGNOSIS — R0789 Other chest pain: Secondary | ICD-10-CM | POA: Diagnosis not present

## 2018-01-16 DIAGNOSIS — Z3A12 12 weeks gestation of pregnancy: Secondary | ICD-10-CM | POA: Diagnosis not present

## 2018-01-16 DIAGNOSIS — K219 Gastro-esophageal reflux disease without esophagitis: Secondary | ICD-10-CM | POA: Diagnosis not present

## 2018-02-05 ENCOUNTER — Ambulatory Visit (INDEPENDENT_AMBULATORY_CARE_PROVIDER_SITE_OTHER): Payer: Medicaid Other | Admitting: Advanced Practice Midwife

## 2018-02-05 VITALS — BP 98/67 | HR 94 | Wt 94.0 lb

## 2018-02-05 DIAGNOSIS — O219 Vomiting of pregnancy, unspecified: Secondary | ICD-10-CM

## 2018-02-05 DIAGNOSIS — Z3483 Encounter for supervision of other normal pregnancy, third trimester: Secondary | ICD-10-CM

## 2018-02-05 DIAGNOSIS — Z3402 Encounter for supervision of normal first pregnancy, second trimester: Secondary | ICD-10-CM

## 2018-02-05 DIAGNOSIS — Z8659 Personal history of other mental and behavioral disorders: Secondary | ICD-10-CM

## 2018-02-05 NOTE — Progress Notes (Addendum)
   PRENATAL VISIT NOTE  Subjective:  Sue Shepherd is a 27 y.o. G3P1011 at [redacted]w[redacted]d being seen today for ongoing prenatal care.  She is currently monitored for the following issues for this low-risk pregnancy and has Supervision of other normal pregnancy, antepartum; Hx of anxiety disorder; and Hx of major depression on their problem list.  Patient reports recent recurrence of anxiety attacks. States she has been experiencing intermittent chest pains and was triaged in a local ED yesterday for anxiety-related chest pains.  Endorses history of counseling as a teenager for anxiety and depression.. Patient verbalizes willingness to be screened by [facility] Provider at [facility]. Patient denies IPV, thoughts of self-harm, previous self-harm attempts.   Contractions: Not present.  .  Movement: Present. Denies leaking of fluid.   The following portions of the patient's history were reviewed and updated as appropriate: allergies, current medications, past family history, past medical history, past social history, past surgical history and problem list. Problem list updated.  Objective:   Vitals:   02/05/18 0824  BP: 98/67  Pulse: 94  Weight: 94 lb (42.6 kg)    Fetal Status: Fetal Heart Rate (bpm): 145   Movement: Present     General:  Alert, oriented and cooperative. Patient is in no acute distress.  Skin: Skin is warm and dry. No rash noted.   Cardiovascular: Normal heart rate noted  Respiratory: Normal respiratory effort, no problems with respiration noted  Abdomen: Soft, gravid, appropriate for gestational age.  Pain/Pressure: Absent     Pelvic: Cervical exam deferred        Extremities: Normal range of motion.  Edema: None  Mental Status: Normal mood and affect. Normal behavior. Normal judgment and thought content.   Assessment and Plan:  Pregnancy: G3P1011 at [redacted]w[redacted]d  1. Encounter for supervision of other normal pregnancy in second trimester - No physical complaints or concerns, continue routine  care - Declined AFP - [place] MFM OB COMP + 14 WK; Future  2. Nausea and vomiting during pregnancy prior to [redacted] weeks gestation --Resolved per patient --Weight up 2 lbs from previous visit, continue to closely monitor   3. Hx of anxiety disorder, major depression --Discussed partner as trigger for some portion of anxiety.  --Encouraged patient to journal stress triggers, severity of symptoms --Left voicemail  and sent in-basket message to Hulda Marin at [facility] for appt. Pt works in [place], says this is a good location for her.    Preterm labor symptoms and general obstetric precautions including but not limited to vaginal bleeding, contractions, leaking of fluid and fetal movement were reviewed in detail with the patient. Please refer to After Visit Summary for other counseling recommendations.  Return in about 4 weeks (around 03/05/2018).  Future Appointments  Date Time Provider Department Center  03/05/2018  8:45 AM Briant Sites [facility]  03/05/2018 10:45 AM [facility] [place] 2 [facility]    Calvert Cantor, CNM 02/05/18  11:04 AM

## 2018-02-05 NOTE — Addendum Note (Signed)
Addended by: Cheree Ditto, Alexa Blish A on: 02/05/2018 11:28 AM   Modules accepted: Orders

## 2018-02-05 NOTE — Patient Instructions (Signed)
Second Trimester of Pregnancy The second trimester is from week 13 through week 28, month 4 through 6. This is often the time in pregnancy that you feel your best. Often times, morning sickness has lessened or quit. You may have more energy, and you may get hungry more often. Your unborn baby (fetus) is growing rapidly. At the end of the sixth month, he or she is about 9 inches long and weighs about 1 pounds. You will likely feel the baby move (quickening) between 18 and 20 weeks of pregnancy.  Research childbirth classes and hospital preregistration at [email or web address]  Follow these instructions at home:  Avoid all smoking, herbs, and alcohol. Avoid drugs not approved by your doctor.  Do not use any tobacco products, including cigarettes, chewing tobacco, and electronic cigarettes. If you need help quitting, ask your doctor. You may get counseling or other support to help you quit.  Only take medicine as told by your doctor. Some medicines are safe and some are not during pregnancy.  Exercise only as told by your doctor. Stop exercising if you start having cramps.  Eat regular, healthy meals.  Wear a good support bra if your breasts are tender.  Do not use hot tubs, steam rooms, or saunas.  Wear your seat belt when driving.  Avoid raw meat, uncooked cheese, and liter boxes and soil used by cats.  Take your prenatal vitamins.  Take 1500-2000 milligrams of calcium daily starting at the 20th week of pregnancy until you deliver your baby.  Try taking medicine that helps you poop (stool softener) as needed, and if your doctor approves. Eat more fiber by eating fresh fruit, vegetables, and whole grains. Drink enough fluids to keep your pee (urine) clear or pale yellow.  Take warm water baths (sitz baths) to soothe pain or discomfort caused by hemorrhoids. Use hemorrhoid cream if your doctor approves.  If you have puffy, bulging veins (varicose veins), wear support hose. Raise  (elevate) your feet for 15 minutes, 3-4 times a day. Limit salt in your diet.  Avoid heavy lifting, wear low heals, and sit up straight.  Rest with your legs raised if you have leg cramps or low back pain.  Visit your dentist if you have not gone during your pregnancy. Use a soft toothbrush to brush your teeth. Be gentle when you floss.  You can have sex (intercourse) unless your doctor tells you not to.  Go to your doctor visits.  Get help if:  You feel dizzy.  You have mild cramps or pressure in your lower belly (abdomen).  You have a nagging pain in your belly area.  You continue to feel sick to your stomach (nauseous), throw up (vomit), or have watery poop (diarrhea).  You have bad smelling fluid coming from your vagina.  You have pain with peeing (urination). Get help right away if:  You have a fever.  You are leaking fluid from your vagina.  You have spotting or bleeding from your vagina.  You have severe belly cramping or pain.  You lose or gain weight rapidly.  You have trouble catching your breath and have chest pain.  You notice sudden or extreme puffiness (swelling) of your face, hands, ankles, feet, or legs.  You have not felt the baby move in over an hour.  You have severe headaches that do not go away with medicine.  You have vision changes. This information is not intended to replace advice given to you by your health care provider. Make   sure you discuss any questions you have with your health care provider. Document Released: 07/18/2009 Document Revised: 09/29/2015 Document Reviewed: 06/24/2012 Elsevier Interactive Patient Education  2017 Elsevier Inc.    

## 2018-02-06 ENCOUNTER — Ambulatory Visit (INDEPENDENT_AMBULATORY_CARE_PROVIDER_SITE_OTHER): Payer: Medicaid Other | Admitting: Clinical

## 2018-02-06 DIAGNOSIS — F419 Anxiety disorder, unspecified: Secondary | ICD-10-CM

## 2018-02-06 NOTE — BH Specialist Note (Signed)
Integrated Behavioral Health Initial Visit  MRN: [id number] Name: Sue Shepherd  Number of Integrated Behavioral Health Clinician visits:: 1/6 Session Start time: 9:25  Session End time: 10:28 Total time: 1 hour  Type of Service: Integrated Behavioral Health- Individual/Family Interpretor:No. Interpretor Name and Language: n/a   Warm Hand Off Completed.       SUBJECTIVE: Sue Shepherd is a 27 y.o. female accompanied by n/a Patient was referred by Clayton Bibles, CNM for anxiety. Patient reports the following symptoms/concerns: Pt states her primary concern today is an increase in panic attacks in the past month, along with fatigue, difficulty staying asleep, headaches, anxiety, and worry. Pt was on medication for anxiety at 16-17yo; has coped without medication since that time by walking away from stressful situations.  Pt prefers to use self-coping strategies, without focusing on her breathing; may consider medication in the future.  Duration of problem: Increase in past month; Severity of problem: moderately severe  OBJECTIVE: Mood: Anxious and Affect: Appropriate Risk of harm to self or others: No plan to harm self or others  LIFE CONTEXT: Family and Social: Pt lives with FOB and 27yo daughter School/Work: Works full-time  Self-Care: Recognizing a greater need for self care Life Changes: Current pregnancy  GOALS ADDRESSED: Patient will: 1. Reduce symptoms of: anxiety and stress 2. Increase knowledge and/or ability of: coping skills and healthy habits  3. Demonstrate ability to: Increase healthy adjustment to current life circumstances  INTERVENTIONS: Interventions utilized: Mindfulness or Management consultant, Sleep Hygiene and Psychoeducation and/or Health Education  Standardized Assessments completed: Not Needed  ASSESSMENT: Patient currently experiencing Anxiety disorder   Patient may benefit from psychoeducation and brief therapeutic interventions regarding  coping with symptoms of anxiety with panic attacks. Marland Kitchen  PLAN: 1. Follow up with behavioral health clinician on : Three weeks 2. Behavioral recommendations:  -Obtain a body pillow to help sleep on side at night (instead of back or belly) -CALM relaxation exercise (modified) twice daily (morning and at bedtime) -Consider additional self-coping grounding strategies, as discussed in office visit, when anxiety escalates -Read educational materials regarding coping with symptoms of anxiety with panic attacks -Consider using [employer]org and educational material about taking medication, for self-education about using BH medication in pregnancy and postpartum  3. Referral(s): Integrated [employer] (In Clinic) 4. "From scale of 1-10, how likely are you to follow plan?": 10  Dereona Kolodny C Kenzlie Disch, LCSW

## 2018-02-26 ENCOUNTER — Encounter (HOSPITAL_COMMUNITY): Payer: Self-pay

## 2018-02-27 ENCOUNTER — Ambulatory Visit: Payer: Self-pay

## 2018-03-03 ENCOUNTER — Ambulatory Visit: Payer: Self-pay

## 2018-03-05 ENCOUNTER — Ambulatory Visit (INDEPENDENT_AMBULATORY_CARE_PROVIDER_SITE_OTHER): Payer: Medicaid Other | Admitting: Advanced Practice Midwife

## 2018-03-05 ENCOUNTER — Ambulatory Visit (HOSPITAL_COMMUNITY)
Admission: RE | Admit: 2018-03-05 | Discharge: 2018-03-05 | Disposition: A | Discharge status: Home / Self Care | Payer: Medicaid Other | Source: Ambulatory Visit | Attending: Advanced Practice Midwife | Admitting: Advanced Practice Midwife

## 2018-03-05 ENCOUNTER — Other Ambulatory Visit: Payer: Self-pay | Admitting: Advanced Practice Midwife

## 2018-03-05 VITALS — BP 93/62 | HR 93 | Wt 96.0 lb

## 2018-03-05 DIAGNOSIS — Z3689 Encounter for other specified antenatal screening: Secondary | ICD-10-CM | POA: Diagnosis not present

## 2018-03-05 DIAGNOSIS — Z3402 Encounter for supervision of normal first pregnancy, second trimester: Secondary | ICD-10-CM

## 2018-03-05 DIAGNOSIS — O359XX0 Maternal care for (suspected) fetal abnormality and damage, unspecified, not applicable or unspecified: Secondary | ICD-10-CM | POA: Insufficient documentation

## 2018-03-05 DIAGNOSIS — Z363 Encounter for antenatal screening for malformations: Secondary | ICD-10-CM | POA: Diagnosis not present

## 2018-03-05 DIAGNOSIS — Z8659 Personal history of other mental and behavioral disorders: Secondary | ICD-10-CM

## 2018-03-05 DIAGNOSIS — Z3A18 18 weeks gestation of pregnancy: Secondary | ICD-10-CM | POA: Insufficient documentation

## 2018-03-05 DIAGNOSIS — Z3480 Encounter for supervision of other normal pregnancy, unspecified trimester: Secondary | ICD-10-CM

## 2018-03-05 NOTE — Progress Notes (Addendum)
   PRENATAL VISIT NOTE  Subjective:  Sue Shepherd is a 27 y.o. G3P1011 at [redacted]w[redacted]d being seen today for ongoing prenatal care.  She is currently monitored for the following issues for this low-risk pregnancy and has Supervision of other normal pregnancy, antepartum; Hx of anxiety disorder; and Hx of major depression on their problem list.  Patient reports no complaints. Patient endorses feeling less anxious since previous visit. Reports one mild panic attack since previous visit but overall feeling very positive. Very excited about anatomy ultrasound today. Contractions: Not present. Vag. Bleeding: None.  Movement: Present. Denies leaking of fluid.   The following portions of the patient's history were reviewed and updated as appropriate: allergies, current medications, past family history, past medical history, past social history, past surgical history and problem list. Problem list updated.  Objective:   Vitals:   03/05/18 0840  BP: 93/62  Pulse: 93  Weight: 96 lb (43.5 kg)    Fetal Status: Fetal Heart Rate (bpm): 152   Movement: Present     General:  Alert, oriented and cooperative. Patient is in no acute distress.  Skin: Skin is warm and dry. No rash noted.   Cardiovascular: Normal heart rate noted  Respiratory: Normal respiratory effort, no problems with respiration noted  Abdomen: Soft, gravid, appropriate for gestational age.  Pain/Pressure: Present     Pelvic: Cervical exam deferred        Extremities: Normal range of motion.  Edema: None  Mental Status: Normal mood and affect. Normal behavior. Normal judgment and thought content.   Assessment and Plan:  Pregnancy: G3P1011 at [redacted]w[redacted]d  1. Supervision of other normal pregnancy, antepartum --No complaints or concerns, continue routine care --Weight up 2 lbs from previous visit.  --Declined AFP --19 week [place] later this morning  2. History of anxiety --S/p meeting with Hulda Marin, LCSW 02/06/18 --Discussed possible  addition of Vistaril 25mg  TID as needed for worsening anxiety. Declined today --Confirmed patient may schedule standalone appts with Asher Muir PRN --Plan for two week mood check postpartum   Preterm labor symptoms and general obstetric precautions including but not limited to vaginal bleeding, contractions, leaking of fluid and fetal movement were reviewed in detail with the patient. Please refer to After Visit Summary for other counseling recommendations.  Return in about 4 weeks (around 04/02/2018).  Future Appointments  Date Time Provider Department Center  03/05/2018 10:45 AM [facility] [place] 2 [facility]    Calvert Cantor, CNM

## 2018-03-10 ENCOUNTER — Ambulatory Visit (INDEPENDENT_AMBULATORY_CARE_PROVIDER_SITE_OTHER): Payer: Medicaid Other | Admitting: Family Medicine

## 2018-03-10 VITALS — BP 97/66 | HR 103 | Wt 98.0 lb

## 2018-03-10 DIAGNOSIS — M545 Low back pain, unspecified: Secondary | ICD-10-CM

## 2018-03-10 DIAGNOSIS — Z3480 Encounter for supervision of other normal pregnancy, unspecified trimester: Secondary | ICD-10-CM

## 2018-03-10 MED ORDER — CYCLOBENZAPRINE HCL 10 MG PO TABS: 10.0000 mg | ORAL_TABLET | Freq: Three times a day (TID) | ORAL | 1 refills | Status: DC | PRN

## 2018-03-10 NOTE — Progress Notes (Signed)
Lower back pain and pelvic pain, started Saturday after bending down. Pt had not taken any meds for pain.

## 2018-03-10 NOTE — Patient Instructions (Addendum)
Therma Care heat wraps Extra Strength tylenol  Back Pain, Adult Back pain is very common. The pain often gets better over time. The cause of back pain is usually not dangerous. Most people can learn to manage their back pain on their own. Follow these instructions at home: Watch your back pain for any changes. The following actions may help to lessen any pain you are feeling:  Stay active. Start with short walks on flat ground if you can. Try to walk farther each day.  Exercise regularly as told by your doctor. Exercise helps your back heal faster. It also helps avoid future injury by keeping your muscles strong and flexible.  Do not sit, drive, or stand in one place for more than 30 minutes.  Do not stay in bed. Resting more than 1-2 days can slow down your recovery.  Be careful when you bend or lift an object. Use good form when lifting: ? Bend at your knees. ? Keep the object close to your body. ? Do not twist.  Sleep on a firm mattress. Lie on your side, and bend your knees. If you lie on your back, put a pillow under your knees.  Take medicines only as told by your doctor.  Put ice on the injured area. ? Put ice in a plastic bag. ? Place a towel between your skin and the bag. ? Leave the ice on for 20 minutes, 2-3 times a day for the first 2-3 days. After that, you can switch between ice and heat packs.  Avoid feeling anxious or stressed. Find good ways to deal with stress, such as exercise.  Maintain a healthy weight. Extra weight puts stress on your back.  Contact a doctor if:  You have pain that does not go away with rest or medicine.  You have worsening pain that goes down into your legs or buttocks.  You have pain that does not get better in one week.  You have pain at night.  You lose weight.  You have a fever or chills. Get help right away if:  You cannot control when you poop (bowel movement) or pee (urinate).  Your arms or legs feel weak.  Your arms  or legs lose feeling (numbness).  You feel sick to your stomach (nauseous) or throw up (vomit).  You have belly (abdominal) pain.  You feel like you may pass out (faint). This information is not intended to replace advice given to you by your health care provider. Make sure you discuss any questions you have with your health care provider. Document Released: 10/10/2007 Document Revised: 09/29/2015 Document Reviewed: 08/25/2013 Elsevier Interactive Patient Education  [employer].

## 2018-03-10 NOTE — Progress Notes (Signed)
   PRENATAL VISIT NOTE  Subjective:  Sue Shepherd is a 27 y.o. G3P1011 at [redacted]w[redacted]d being seen today for ongoing prenatal care.  She is currently monitored for the following issues for this low-risk pregnancy and has Supervision of other normal pregnancy, antepartum; Hx of anxiety disorder; and Hx of major depression on their problem list.  Patient reports backache. Happened acutely after bending over. Has tired some heat. Not really getting better. Vag. Bleeding: None.  Movement: Present. Denies leaking of fluid.   The following portions of the patient's history were reviewed and updated as appropriate: allergies, current medications, past family history, past medical history, past social history, past surgical history and problem list. Problem list updated.  Objective:   Vitals:   03/10/18 1601  BP: 97/66  Pulse: (!) 103  Weight: 98 lb (44.5 kg)    Fetal Status:     Movement: Present     General:  Alert, oriented and cooperative. Patient is in no acute distress.  Skin: Skin is warm and dry. No rash noted.   Cardiovascular: Normal heart rate noted  Respiratory: Normal respiratory effort, no problems with respiration noted  Abdomen: Soft, gravid, appropriate for gestational age.        Pelvic: Cervical exam deferred        Extremities: Normal range of motion.     Mental Status: Normal mood and affect. Normal behavior. Normal judgment and thought content.   Assessment and Plan:  Pregnancy: G3P1011 at [redacted]w[redacted]d  1. Supervision of other normal pregnancy, antepartum   2. Acute bilateral low back pain without sciatica Trial heat, tylenol - cyclobenzaprine (FLEXERIL) 10 MG tablet; Take 1 tablet (10 mg total) by mouth every 8 (eight) hours as needed for muscle spasms.  Dispense: 30 tablet; Refill: 1  General obstetric precautions including but not limited to vaginal bleeding, contractions, leaking of fluid and fetal movement were reviewed in detail with the patient. Please refer to After  Visit Summary for other counseling recommendations.  Return in about 4 weeks (around 04/07/2018).  Future Appointments  Date Time Provider Department Center  04/02/2018  8:15 AM Calvert Cantor, CNM [facility]    Reva Bores, MD

## 2018-04-02 ENCOUNTER — Encounter: Payer: Self-pay | Admitting: Advanced Practice Midwife

## 2018-04-09 ENCOUNTER — Encounter: Payer: Self-pay | Admitting: Family Medicine

## 2018-04-10 ENCOUNTER — Ambulatory Visit (INDEPENDENT_AMBULATORY_CARE_PROVIDER_SITE_OTHER): Payer: Medicaid Other | Admitting: Obstetrics & Gynecology

## 2018-04-10 ENCOUNTER — Encounter: Payer: Self-pay | Admitting: Obstetrics & Gynecology

## 2018-04-10 VITALS — BP 98/64 | HR 89 | Wt 101.0 lb

## 2018-04-10 DIAGNOSIS — Z3482 Encounter for supervision of other normal pregnancy, second trimester: Secondary | ICD-10-CM

## 2018-04-10 DIAGNOSIS — Z3A24 24 weeks gestation of pregnancy: Secondary | ICD-10-CM

## 2018-04-10 DIAGNOSIS — Z3480 Encounter for supervision of other normal pregnancy, unspecified trimester: Secondary | ICD-10-CM

## 2018-04-10 NOTE — Progress Notes (Signed)
   PRENATAL VISIT NOTE  Subjective:  [name] is a 27 y.o. G3P1011 at 6270w1d being seen today for ongoing prenatal care.  She is currently monitored for the following issues for this low-risk pregnancy and has Supervision of other normal pregnancy, antepartum; Hx of anxiety disorder; and Hx of major depression on their problem list.  Patient reports no complaints.  Contractions: Not present. Vag. Bleeding: None.  Movement: Present. Denies leaking of fluid.   The following portions of the patient's history were reviewed and updated as appropriate: allergies, current medications, past family history, past medical history, past social history, past surgical history and problem list. Problem list updated.  Objective:   Vitals:   04/10/18 1617  BP: 98/64  Pulse: 89  Weight: 101 lb (45.8 kg)    Fetal Status: Fetal Heart Rate (bpm): 149 Fundal Height: 24 cm Movement: Present     General:  Alert, oriented and cooperative. Patient is in no acute distress.  Skin: Skin is warm and dry. No rash noted.   Cardiovascular: Normal heart rate noted  Respiratory: Normal respiratory effort, no problems with respiration noted  Abdomen: Soft, gravid, appropriate for gestational age.  Pain/Pressure: Absent     Pelvic: Cervical exam deferred        Extremities: Normal range of motion.  Edema: None  Mental Status: Normal mood and affect. Normal behavior. Normal judgment and thought content.   Assessment and Plan:  Pregnancy: G3P1011 at 3370w1d  1. Supervision of other normal pregnancy, antepartum Third trimester labs next visit. - Glucose Tolerance, 2 Hours w/1 Hour; Future - CBC; Future - RPR; Future - HIV Antibody (routine testing w rflx); Future Preterm labor symptoms and general obstetric precautions including but not limited to vaginal bleeding, contractions, leaking of fluid and fetal movement were reviewed in detail with the patient. Please refer to After Visit Summary for other counseling  recommendations.  Return in about 4 weeks (around 05/08/2018) for 2 hr GTT, 3rd trimester labs, TDap, OB Visit.   Jaynie CollinsUgonna Zaneta Lightcap, MD

## 2018-04-10 NOTE — Patient Instructions (Signed)
Tdap Vaccine (Tetanus, Diphtheria and Pertussis): What You Need to Know 1. Why get vaccinated? Tetanus, diphtheria and pertussis are very serious diseases. Tdap vaccine can protect us from these diseases. And, Tdap vaccine given to pregnant women can protect newborn babies against pertussis. TETANUS (Lockjaw) is rare in the [place] today. It causes painful muscle tightening and stiffness, usually all over the body.  It can lead to tightening of muscles in the head and neck so you can't open your mouth, swallow, or sometimes even breathe. Tetanus kills about 1 out of 10 people who are infected even after receiving the best medical care.  DIPHTHERIA is also rare in the [place] today. It can cause a thick coating to form in the back of the throat.  It can lead to breathing problems, heart failure, paralysis, and death.  PERTUSSIS (Whooping Cough) causes severe coughing spells, which can cause difficulty breathing, vomiting and disturbed sleep.  It can also lead to weight loss, incontinence, and rib fractures. Up to 2 in 100 adolescents and 5 in 100 adults with pertussis are hospitalized or have complications, which could include pneumonia or death.  These diseases are caused by bacteria. Diphtheria and pertussis are spread from person to person through secretions from coughing or sneezing. Tetanus enters the body through cuts, scratches, or wounds. Before vaccines, as many as 200,000 cases of diphtheria, 200,000 cases of pertussis, and hundreds of cases of tetanus, were reported in the [place] each year. Since vaccination began, reports of cases for tetanus and diphtheria have dropped by about 99% and for pertussis by about 80%. 2. Tdap vaccine Tdap vaccine can protect adolescents and adults from tetanus, diphtheria, and pertussis. One dose of Tdap is routinely given at age 11 or 12. People who did not get Tdap at that age should get it as soon as possible. Tdap is especially  important for healthcare professionals and anyone having close contact with a baby younger than 12 months. Pregnant women should get a dose of Tdap during every pregnancy, to protect the newborn from pertussis. Infants are most at risk for severe, life-threatening complications from pertussis. Another vaccine, called Td, protects against tetanus and diphtheria, but not pertussis. A Td booster should be given every 10 years. Tdap may be given as one of these boosters if you have never gotten Tdap before. Tdap may also be given after a severe cut or burn to prevent tetanus infection. Your doctor or the person giving you the vaccine can give you more information. Tdap may safely be given at the same time as other vaccines. 3. Some people should not get this vaccine  A person who has ever had a life-threatening allergic reaction after a previous dose of any diphtheria, tetanus or pertussis containing vaccine, OR has a severe allergy to any part of this vaccine, should not get Tdap vaccine. Tell the person giving the vaccine about any severe allergies.  Anyone who had coma or long repeated seizures within 7 days after a childhood dose of DTP or DTaP, or a previous dose of Tdap, should not get Tdap, unless a cause other than the vaccine was found. They can still get Td.  Talk to your doctor if you: ? have seizures or another nervous system problem, ? had severe pain or swelling after any vaccine containing diphtheria, tetanus or pertussis, ? ever had a condition called Guillain-Barr Syndrome (GBS), ? aren't feeling well on the day the shot is scheduled. 4. Risks With any medicine, including   vaccines, there is a chance of side effects. These are usually mild and go away on their own. Serious reactions are also possible but are rare. Most people who get Tdap vaccine do not have any problems with it. Mild problems following Tdap: (Did not interfere with activities)  Pain where the shot was given (about  3 in 4 adolescents or 2 in 3 adults)  Redness or swelling where the shot was given (about 1 person in 5)  Mild fever of at least 100.4F (up to about 1 in 25 adolescents or 1 in 100 adults)  Headache (about 3 or 4 people in 10)  Tiredness (about 1 person in 3 or 4)  Nausea, vomiting, diarrhea, stomach ache (up to 1 in 4 adolescents or 1 in 10 adults)  Chills, sore joints (about 1 person in 10)  Body aches (about 1 person in 3 or 4)  Rash, swollen glands (uncommon)  Moderate problems following Tdap: (Interfered with activities, but did not require medical attention)  Pain where the shot was given (up to 1 in 5 or 6)  Redness or swelling where the shot was given (up to about 1 in 16 adolescents or 1 in 12 adults)  Fever over 102F (about 1 in 100 adolescents or 1 in 250 adults)  Headache (about 1 in 7 adolescents or 1 in 10 adults)  Nausea, vomiting, diarrhea, stomach ache (up to 1 or 3 people in 100)  Swelling of the entire arm where the shot was given (up to about 1 in 500).  Severe problems following Tdap: (Unable to perform usual activities; required medical attention)  Swelling, severe pain, bleeding and redness in the arm where the shot was given (rare).  Problems that could happen after any vaccine:  People sometimes faint after a medical procedure, including vaccination. Sitting or lying down for about 15 minutes can help prevent fainting, and injuries caused by a fall. Tell your doctor if you feel dizzy, or have vision changes or ringing in the ears.  Some people get severe pain in the shoulder and have difficulty moving the arm where a shot was given. This happens very rarely.  Any medication can cause a severe allergic reaction. Such reactions from a vaccine are very rare, estimated at fewer than 1 in a million doses, and would happen within a few minutes to a few hours after the vaccination. As with any medicine, there is a very remote chance of a vaccine  causing a serious injury or death. The safety of vaccines is always being monitored. For more information, visit: www.cdc.gov/vaccinesafety/ 5. What if there is a serious problem? What should I look for? Look for anything that concerns you, such as signs of a severe allergic reaction, very high fever, or unusual behavior. Signs of a severe allergic reaction can include hives, swelling of the face and throat, difficulty breathing, a fast heartbeat, dizziness, and weakness. These would usually start a few minutes to a few hours after the vaccination. What should I do?  If you think it is a severe allergic reaction or other emergency that can't wait, call 9-1-1 or get the person to the nearest hospital. Otherwise, call your doctor.  Afterward, the reaction should be reported to the Vaccine Adverse Event Reporting System (VAERS). Your doctor might file this report, or you can do it yourself through the VAERS web site at www.vaers.hhs.gov, or by calling 1-800-822-7967. ? VAERS does not give medical advice. 6. The National Vaccine Injury Compensation Program The National   Vaccine Injury Compensation Program (VICP) is a federal program that was created to compensate people who may have been injured by certain vaccines. Persons who believe they may have been injured by a vaccine can learn about the program and about filing a claim by calling 1-800-338-2382 or visiting the VICP website at www.hrsa.gov/vaccinecompensation. There is a time limit to file a claim for compensation. 7. How can I learn more?  Ask your doctor. He or she can give you the vaccine package insert or suggest other sources of information.  Call your local or state health department.  Contact the Centers for Disease Control and Prevention (CDC): ? Call 1-800-232-4636 (1-800-CDC-INFO) or ? Visit CDC's website at www.cdc.gov/vaccines CDC Tdap Vaccine VIS (06/30/13) This information is not intended to replace advice given to you by your  health care provider. Make sure you discuss any questions you have with your health care provider. Document Released: 10/23/2011 Document Revised: 01/12/2016 Document Reviewed: 01/12/2016 Elsevier Interactive Patient Education  2017 Elsevier Inc.  

## 2018-05-07 NOTE — L&D Delivery Note (Addendum)
OB/GYN Faculty Practice Delivery Note  Sue Shepherd is a 28 y.o. G3P1011 s/p SVD at [redacted]w[redacted]d. She was admitted for SOL.   ROM: 3h 72m with clear fluid GBS Status: negative Maximum Maternal Temperature: 97.7  Labor Progress: . Augmentation with AROM  Delivery Date/Time: 1519 Delivery: Called to room and patient was complete and pushing. Head delivered LOA. Nuchal cord present x1, delivered through. Shoulder and body delivered in usual fashion. Infant with spontaneous cry, placed on mother's abdomen, dried and stimulated. Cord clamped x 2 after 1-minute delay, and cut by FOB. Cord blood drawn. Placenta delivered spontaneously with gentle cord traction. Fundus firm with massage and Pitocin. Labia, perineum, vagina, and cervix inspected inspected with small R periurethral abrasion, hemostatic.   Placenta: delivered spontaneously, intact Complications: none Lacerations: small R periurethral abrasion, hemostatic, elected not to repair EBL:  Infant: vigorous female  APGARs 9 and 9  weight pending  Burman Nieves, MD Family Medicine Resident    OB FELLOW DELIVERY ATTESTATION  I was gloved and present for the delivery in its entirety, and I agree with the above resident's note.    Marcy Siren, D.O. OB Fellow  07/27/2018, 3:40 PM

## 2018-05-08 ENCOUNTER — Ambulatory Visit (INDEPENDENT_AMBULATORY_CARE_PROVIDER_SITE_OTHER): Payer: Medicaid Other | Admitting: Obstetrics and Gynecology

## 2018-05-08 DIAGNOSIS — Z3480 Encounter for supervision of other normal pregnancy, unspecified trimester: Secondary | ICD-10-CM | POA: Diagnosis not present

## 2018-05-08 DIAGNOSIS — Z3483 Encounter for supervision of other normal pregnancy, third trimester: Secondary | ICD-10-CM

## 2018-05-08 DIAGNOSIS — Z23 Encounter for immunization: Secondary | ICD-10-CM | POA: Diagnosis not present

## 2018-05-08 NOTE — Patient Instructions (Signed)
Tdap Vaccine (Tetanus, Diphtheria and Pertussis): What You Need to Know  1. Why get vaccinated?  Tetanus, diphtheria and pertussis are very serious diseases. Tdap vaccine can protect us from these diseases. And, Tdap vaccine given to pregnant women can protect newborn babies against pertussis..  TETANUS (Lockjaw) is rare in the [place] today. It causes painful muscle tightening and stiffness, usually all over the body.  · It can lead to tightening of muscles in the head and neck so you can't open your mouth, swallow, or sometimes even breathe. Tetanus kills about 1 out of 10 people who are infected even after receiving the best medical care.  DIPHTHERIA is also rare in the [place] today. It can cause a thick coating to form in the back of the throat.  · It can lead to breathing problems, heart failure, paralysis, and death.  PERTUSSIS (Whooping Cough) causes severe coughing spells, which can cause difficulty breathing, vomiting and disturbed sleep.  · It can also lead to weight loss, incontinence, and rib fractures. Up to 2 in 100 adolescents and 5 in 100 adults with pertussis are hospitalized or have complications, which could include pneumonia or death.  These diseases are caused by bacteria. Diphtheria and pertussis are spread from person to person through secretions from coughing or sneezing. Tetanus enters the body through cuts, scratches, or wounds.  Before vaccines, as many as 200,000 cases of diphtheria, 200,000 cases of pertussis, and hundreds of cases of tetanus, were reported in the [place] each year. Since vaccination began, reports of cases for tetanus and diphtheria have dropped by about 99% and for pertussis by about 80%.  2. Tdap vaccine  Tdap vaccine can protect adolescents and adults from tetanus, diphtheria, and pertussis. One dose of Tdap is routinely given at age 11 or 12. People who did not get Tdap at that age should get it as soon as possible.  Tdap is especially important  for healthcare professionals and anyone having close contact with a baby younger than 12 months.  Pregnant women should get a dose of Tdap during every pregnancy, to protect the newborn from pertussis. Infants are most at risk for severe, life-threatening complications from pertussis.  Another vaccine, called Td, protects against tetanus and diphtheria, but not pertussis. A Td booster should be given every 10 years. Tdap may be given as one of these boosters if you have never gotten Tdap before. Tdap may also be given after a severe cut or burn to prevent tetanus infection.  Your doctor or the person giving you the vaccine can give you more information.  Tdap may safely be given at the same time as other vaccines.  3. Some people should not get this vaccine  · A person who has ever had a life-threatening allergic reaction after a previous dose of any diphtheria, tetanus or pertussis containing vaccine, OR has a severe allergy to any part of this vaccine, should not get Tdap vaccine. Tell the person giving the vaccine about any severe allergies.  · Anyone who had coma or long repeated seizures within 7 days after a childhood dose of DTP or DTaP, or a previous dose of Tdap, should not get Tdap, unless a cause other than the vaccine was found. They can still get Td.  · Talk to your doctor if you:  ? have seizures or another nervous system problem,  ? had severe pain or swelling after any vaccine containing diphtheria, tetanus or pertussis,  ? ever had a condition   called Guillain-Barré Syndrome (GBS),  ? aren't feeling well on the day the shot is scheduled.  4. Risks  With any medicine, including vaccines, there is a chance of side effects. These are usually mild and go away on their own. Serious reactions are also possible but are rare.  Most people who get Tdap vaccine do not have any problems with it.  Mild problems following Tdap  (Did not interfere with activities)  · Pain where the shot was given (about 3 in 4  adolescents or 2 in 3 adults)  · Redness or swelling where the shot was given (about 1 person in 5)  · Mild fever of at least 100.4°F (up to about 1 in 25 adolescents or 1 in 100 adults)  · Headache (about 3 or 4 people in 10)  · Tiredness (about 1 person in 3 or 4)  · Nausea, vomiting, diarrhea, stomach ache (up to 1 in 4 adolescents or 1 in 10 adults)  · Chills, sore joints (about 1 person in 10)  · Body aches (about 1 person in 3 or 4)  · Rash, swollen glands (uncommon)  Moderate problems following Tdap  (Interfered with activities, but did not require medical attention)  · Pain where the shot was given (up to 1 in 5 or 6)  · Redness or swelling where the shot was given (up to about 1 in 16 adolescents or 1 in 12 adults)  · Fever over 102°F (about 1 in 100 adolescents or 1 in 250 adults)  · Headache (about 1 in 7 adolescents or 1 in 10 adults)  · Nausea, vomiting, diarrhea, stomach ache (up to 1 or 3 people in 100)  · Swelling of the entire arm where the shot was given (up to about 1 in 500).  Severe problems following Tdap  (Unable to perform usual activities; required medical attention)  · Swelling, severe pain, bleeding and redness in the arm where the shot was given (rare).  Problems that could happen after any vaccine:  · People sometimes faint after a medical procedure, including vaccination. Sitting or lying down for about 15 minutes can help prevent fainting, and injuries caused by a fall. Tell your doctor if you feel dizzy, or have vision changes or ringing in the ears.  · Some people get severe pain in the shoulder and have difficulty moving the arm where a shot was given. This happens very rarely.  · Any medication can cause a severe allergic reaction. Such reactions from a vaccine are very rare, estimated at fewer than 1 in a million doses, and would happen within a few minutes to a few hours after the vaccination.  As with any medicine, there is a very remote chance of a vaccine causing a serious  injury or death.  The safety of vaccines is always being monitored. For more information, visit: www.cdc.gov/vaccinesafety/  5. What if there is a serious problem?  What should I look for?  · Look for anything that concerns you, such as signs of a severe allergic reaction, very high fever, or unusual behavior.  Signs of a severe allergic reaction can include hives, swelling of the face and throat, difficulty breathing, a fast heartbeat, dizziness, and weakness. These would usually start a few minutes to a few hours after the vaccination.  What should I do?  · If you think it is a severe allergic reaction or other emergency that can't wait, call 9-1-1 or get the person to the nearest hospital. Otherwise,   call your doctor.  · Afterward, the reaction should be reported to the Vaccine Adverse Event Reporting System (VAERS). Your doctor might file this report, or you can do it yourself through the VAERS web site at www.vaers.hhs.gov, or by calling 1-800-822-7967.  VAERS does not give medical advice.  6. The National Vaccine Injury Compensation Program  The National Vaccine Injury Compensation Program (VICP) is a federal program that was created to compensate people who may have been injured by certain vaccines.  Persons who believe they may have been injured by a vaccine can learn about the program and about filing a claim by calling 1-800-338-2382 or visiting the VICP website at www.hrsa.gov/vaccinecompensation. There is a time limit to file a claim for compensation.  7. How can I learn more?  · Ask your doctor. He or she can give you the vaccine package insert or suggest other sources of information.  · Call your local or state health department.  · Contact the Centers for Disease Control and Prevention (CDC):  ? Call 1-800-232-4636 (1-800-CDC-INFO) or  ? Visit CDC's website at www.cdc.gov/vaccines  Vaccine Information Statement Tdap Vaccine (06/30/2013)  This information is not intended to replace advice given to you  by your health care provider. Make sure you discuss any questions you have with your health care provider.  Document Released: 10/23/2011 Document Revised: 12/09/2017 Document Reviewed: 12/09/2017  Elsevier Interactive Patient Education © 2019 [employer].

## 2018-05-08 NOTE — Progress Notes (Signed)
Prenatal Visit Note Date: 05/08/2018 Clinic: [facility]  Subjective:  Sue Shepherd is a 28 y.o. G3P1011 at [redacted]w[redacted]d being seen today for ongoing prenatal care.  She is currently monitored for the following issues for this low-risk pregnancy and has Supervision of other normal pregnancy, antepartum; Hx of anxiety disorder; and Hx of major depression on their problem list.  Patient reports no complaints.   Contractions: Irritability. Vag. Bleeding: None.  Movement: Present. Denies leaking of fluid.   The following portions of the patient's history were reviewed and updated as appropriate: allergies, current medications, past family history, past medical history, past social history, past surgical history and problem list. Problem list updated.  Objective:   Vitals:   05/08/18 0923  BP: 106/70  Pulse: 92  Weight: 103 lb 12.8 oz (47.1 kg)    Fetal Status: Fetal Heart Rate (bpm): 146 Fundal Height: 28 cm Movement: Present     General:  Alert, oriented and cooperative. Patient is in no acute distress.  Skin: Skin is warm and dry. No rash noted.   Cardiovascular: Normal heart rate noted  Respiratory: Normal respiratory effort, no problems with respiration noted  Abdomen: Soft, gravid, appropriate for gestational age. Pain/Pressure: Present     Pelvic:  Cervical exam deferred        Extremities: Normal range of motion.  Edema: None  Mental Status: Normal mood and affect. Normal behavior. Normal judgment and thought content.   Urinalysis:      Assessment and Plan:  Pregnancy: G3P1011 at [redacted]w[redacted]d  1. Supervision of other normal pregnancy, antepartum Routine care. Unsure about BC. Weight up about 7lbs and went up to the low 110s in her last pregnancy (6lbs 11oz for newborn weight). D/w her re: goals and pt states she eats well and will try to eat more.  - HIV Antibody (routine testing w rflx) - RPR - CBC - Glucose Tolerance, 2 Hours w/1 Hour - Tdap vaccine greater than  or equal to 7yo IM  Preterm labor symptoms and general obstetric precautions including but not limited to vaginal bleeding, contractions, leaking of fluid and fetal movement were reviewed in detail with the patient. Please refer to After Visit Summary for other counseling recommendations.  Return in about 2 weeks (around 05/22/2018).   [place] Bing, MD

## 2018-05-09 LAB — CBC
Hematocrit: 32.2 % — ABNORMAL LOW (ref 34.0–46.6)
Hemoglobin: 11.4 g/dL (ref 11.1–15.9)
MCH: 32.9 pg (ref 26.6–33.0)
MCHC: 35.4 g/dL (ref 31.5–35.7)
MCV: 93 fL (ref 79–97)
Platelets: 301 10*3/uL (ref 150–450)
RBC: 3.47 x10E6/uL — ABNORMAL LOW (ref 3.77–5.28)
RDW: 11.8 % — ABNORMAL LOW (ref 12.3–15.4)
WBC: 13.9 10*3/uL — ABNORMAL HIGH (ref 3.4–10.8)

## 2018-05-09 LAB — RPR: RPR Ser Ql: NONREACTIVE

## 2018-05-09 LAB — GLUCOSE TOLERANCE, 2 HOURS W/ 1HR
Glucose, 1 hour: 152 mg/dL (ref 65–179)
Glucose, 2 hour: 150 mg/dL (ref 65–152)
Glucose, Fasting: 78 mg/dL (ref 65–91)

## 2018-05-09 LAB — HIV ANTIBODY (ROUTINE TESTING W REFLEX): HIV Screen 4th Generation wRfx: NONREACTIVE

## 2018-05-22 ENCOUNTER — Ambulatory Visit (INDEPENDENT_AMBULATORY_CARE_PROVIDER_SITE_OTHER): Payer: Medicaid Other | Admitting: Family Medicine

## 2018-05-22 VITALS — BP 91/60 | HR 91 | Wt 104.0 lb

## 2018-05-22 DIAGNOSIS — Z3480 Encounter for supervision of other normal pregnancy, unspecified trimester: Secondary | ICD-10-CM

## 2018-05-22 NOTE — Progress Notes (Signed)
   PRENATAL VISIT NOTE  Subjective:  Sue Shepherd is a 28 y.o. G3P1011 at [redacted]w[redacted]d being seen today for ongoing prenatal care.  She is currently monitored for the following issues for this low-risk pregnancy and has Supervision of other normal pregnancy, antepartum; Hx of anxiety disorder; and Hx of major depression on their problem list.  Patient reports no complaints.  Contractions: Not present. Vag. Bleeding: None.  Movement: Present. Denies leaking of fluid.   The following portions of the patient's history were reviewed and updated as appropriate: allergies, current medications, past family history, past medical history, past social history, past surgical history and problem list. Problem list updated.  Objective:   Vitals:   05/22/18 0856  BP: 91/60  Pulse: 91  Weight: 104 lb (47.2 kg)    Fetal Status: Fetal Heart Rate (bpm): 160 Fundal Height: 29 cm Movement: Present     General:  Alert, oriented and cooperative. Patient is in no acute distress.  Skin: Skin is warm and dry. No rash noted.   Cardiovascular: Normal heart rate noted  Respiratory: Normal respiratory effort, no problems with respiration noted  Abdomen: Soft, gravid, appropriate for gestational age.  Pain/Pressure: Present     Pelvic: Cervical exam deferred        Extremities: Normal range of motion.  Edema: Trace  Mental Status: Normal mood and affect. Normal behavior. Normal judgment and thought content.   Assessment and Plan:  Pregnancy: G3P1011 at [redacted]w[redacted]d  1. Supervision of other normal pregnancy, antepartum Continue routine prenatal care. Poor weight gain 8 lbs total.   Preterm labor symptoms and general obstetric precautions including but not limited to vaginal bleeding, contractions, leaking of fluid and fetal movement were reviewed in detail with the patient. Please refer to After Visit Summary for other counseling recommendations.  Return in 2 weeks (on 06/05/2018).  No future appointments.  Reva Bores, MD

## 2018-05-22 NOTE — Patient Instructions (Signed)

## 2018-06-05 ENCOUNTER — Ambulatory Visit (INDEPENDENT_AMBULATORY_CARE_PROVIDER_SITE_OTHER): Payer: Medicaid Other | Admitting: Obstetrics & Gynecology

## 2018-06-05 VITALS — BP 99/62 | HR 72 | Wt 106.6 lb

## 2018-06-05 DIAGNOSIS — Z3483 Encounter for supervision of other normal pregnancy, third trimester: Secondary | ICD-10-CM

## 2018-06-05 DIAGNOSIS — Z3480 Encounter for supervision of other normal pregnancy, unspecified trimester: Secondary | ICD-10-CM

## 2018-06-05 NOTE — Patient Instructions (Signed)
Return to clinic for any scheduled appointments or obstetric concerns, or go to [facility] for evaluation  

## 2018-06-05 NOTE — Progress Notes (Signed)
   PRENATAL VISIT NOTE  Subjective:  Sue Shepherd is a 28 y.o. G3P1011 at [redacted]w[redacted]d being seen today for ongoing prenatal care.  She is currently monitored for the following issues for this low-risk pregnancy and has Supervision of other normal pregnancy, antepartum; Hx of anxiety disorder; and Hx of major depression on their problem list.  Patient reports no complaints.  Contractions: Not present.  .  Movement: Present. Denies leaking of fluid.   The following portions of the patient's history were reviewed and updated as appropriate: allergies, current medications, past family history, past medical history, past social history, past surgical history and problem list. Problem list updated.  Objective:   Vitals:   06/05/18 1454  BP: 99/62  Pulse: 72  Weight: 106 lb 9.6 oz (48.4 kg)    Fetal Status: Fetal Heart Rate (bpm): 162 Fundal Height: 30 cm Movement: Present     General:  Alert, oriented and cooperative. Patient is in no acute distress.  Skin: Skin is warm and dry. No rash noted.   Cardiovascular: Normal heart rate noted  Respiratory: Normal respiratory effort, no problems with respiration noted  Abdomen: Soft, gravid, appropriate for gestational age.  Pain/Pressure: Absent     Pelvic: Cervical exam deferred        Extremities: Normal range of motion.  Edema: Trace  Mental Status: Normal mood and affect. Normal behavior. Normal judgment and thought content.   Assessment and Plan:  Pregnancy: G3P1011 at [redacted]w[redacted]d  1. Supervision of other normal pregnancy, antepartum No complaints today. Doing well. Preterm labor symptoms and general obstetric precautions including but not limited to vaginal bleeding, contractions, leaking of fluid and fetal movement were reviewed in detail with the patient. Please refer to After Visit Summary for other counseling recommendations.  Return in about 2 weeks (around 06/19/2018) for OB Visit.  Future Appointments  Date Time Provider Department Center   06/19/2018  3:00 PM [place] Bing, MD [facility]  07/03/2018  8:45 AM [place] Bing, MD [facility]    Jaynie Collins, MD

## 2018-06-19 ENCOUNTER — Ambulatory Visit (INDEPENDENT_AMBULATORY_CARE_PROVIDER_SITE_OTHER): Payer: Medicaid Other | Admitting: Obstetrics and Gynecology

## 2018-06-19 VITALS — BP 100/63 | HR 97 | Wt 106.0 lb

## 2018-06-19 DIAGNOSIS — Z3483 Encounter for supervision of other normal pregnancy, third trimester: Secondary | ICD-10-CM

## 2018-06-19 DIAGNOSIS — Z3480 Encounter for supervision of other normal pregnancy, unspecified trimester: Secondary | ICD-10-CM

## 2018-06-19 DIAGNOSIS — Z3A34 34 weeks gestation of pregnancy: Secondary | ICD-10-CM

## 2018-06-19 NOTE — Progress Notes (Signed)
Prenatal Visit Note Date: 06/19/2018 Clinic: [facility]  Subjective:  Sue Shepherd is a 29 y.o. G3P1011 at [redacted]w[redacted]d being seen today for ongoing prenatal care.  She is currently monitored for the following issues for this low-risk pregnancy and has Supervision of other normal pregnancy, antepartum; Hx of anxiety disorder; and Hx of major depression on their problem list.  Patient reports no complaints.   Contractions: Irritability. Vag. Bleeding: None.  Movement: Present. Denies leaking of fluid.   The following portions of the patient's history were reviewed and updated as appropriate: allergies, current medications, past family history, past medical history, past social history, past surgical history and problem list. Problem list updated.  Objective:   Vitals:   06/19/18 1447  BP: 100/63  Pulse: 97  Weight: 106 lb (48.1 kg)    Fetal Status: Fetal Heart Rate (bpm): 135 Fundal Height: 33 cm Movement: Present     General:  Alert, oriented and cooperative. Patient is in no acute distress.  Skin: Skin is warm and dry. No rash noted.   Cardiovascular: Normal heart rate noted  Respiratory: Normal respiratory effort, no problems with respiration noted  Abdomen: Soft, gravid, appropriate for gestational age. Pain/Pressure: Absent     Pelvic:  Cervical exam deferred        Extremities: Normal range of motion.  Edema: Trace  Mental Status: Normal mood and affect. Normal behavior. Normal judgment and thought content.   Urinalysis:      Assessment and Plan:  Pregnancy: G3P1011 at [redacted]w[redacted]d  Routine care. Watch FHs Weight up about 10lbs. Pt gained about 20lbs with last pregnancy BC optionds d/w her   Preterm labor symptoms and general obstetric precautions including but not limited to vaginal bleeding, contractions, leaking of fluid and fetal movement were reviewed in detail with the patient. Please refer to After Visit Summary for other counseling recommendations.   Return in about 2 weeks (around 07/03/2018).   [place] Bing, MD

## 2018-07-03 ENCOUNTER — Other Ambulatory Visit (HOSPITAL_COMMUNITY)
Admission: RE | Admit: 2018-07-03 | Discharge: 2018-07-03 | Disposition: A | Discharge status: Home / Self Care | Payer: Medicaid Other | Source: Ambulatory Visit | Attending: Obstetrics and Gynecology | Admitting: Obstetrics and Gynecology

## 2018-07-03 ENCOUNTER — Ambulatory Visit (INDEPENDENT_AMBULATORY_CARE_PROVIDER_SITE_OTHER): Payer: Medicaid Other | Admitting: Obstetrics and Gynecology

## 2018-07-03 VITALS — BP 94/62 | HR 72 | Wt 106.0 lb

## 2018-07-03 DIAGNOSIS — Z3A36 36 weeks gestation of pregnancy: Secondary | ICD-10-CM

## 2018-07-03 DIAGNOSIS — Z3480 Encounter for supervision of other normal pregnancy, unspecified trimester: Secondary | ICD-10-CM | POA: Diagnosis not present

## 2018-07-03 DIAGNOSIS — O26843 Uterine size-date discrepancy, third trimester: Secondary | ICD-10-CM

## 2018-07-03 DIAGNOSIS — Z3483 Encounter for supervision of other normal pregnancy, third trimester: Secondary | ICD-10-CM

## 2018-07-03 NOTE — Progress Notes (Signed)
Prenatal Visit Note Date: 07/03/2018 Clinic: [facility]  Subjective:  Sue Shepherd is a 28 y.o. G3P1011 at [redacted]w[redacted]d being seen today for ongoing prenatal care.  She is currently monitored for the following issues for this low-risk pregnancy and has Supervision of other normal pregnancy, antepartum; Hx of anxiety disorder; Hx of major depression; and Fundal height low for dates in third trimester on their problem list.  Patient reports no complaints.   Contractions: Irritability. Vag. Bleeding: None.  Movement: Present. Denies leaking of fluid.   The following portions of the patient's history were reviewed and updated as appropriate: allergies, current medications, past family history, past medical history, past social history, past surgical history and problem list. Problem list updated.  Objective:   Vitals:   07/03/18 0853  BP: 94/62  Pulse: 72  Weight: 106 lb (48.1 kg)    Fetal Status: Fetal Heart Rate (bpm): 147 Fundal Height: 34 cm Movement: Present  Presentation: Vertex  General:  Alert, oriented and cooperative. Patient is in no acute distress.  Skin: Skin is warm and dry. No rash noted.   Cardiovascular: Normal heart rate noted  Respiratory: Normal respiratory effort, no problems with respiration noted  Abdomen: Soft, gravid, appropriate for gestational age. Pain/Pressure: Absent     Pelvic:  Cervical exam deferred Dilation: 1 Effacement (%): 20 Station: Ballotable  Extremities: Normal range of motion.  Edema: Trace  Mental Status: Normal mood and affect. Normal behavior. Normal judgment and thought content.   Urinalysis:      Assessment and Plan:  Pregnancy: G3P1011 at [redacted]w[redacted]d  1. Supervision of other normal pregnancy, antepartum Routine care - Strep Gp B NAA - Cervicovaginal ancillary only( [place]) - Culture, beta strep (group b only) - [place] MFM OB FOLLOW UP; Future  2. Fundal height low for dates in third trimester Got up to about 112 last  pregnancy. FOB is on the smaller side. [facility] precautions given. Growth u/s - [place] MFM OB FOLLOW UP; Future  Preterm labor symptoms and general obstetric precautions including but not limited to vaginal bleeding, contractions, leaking of fluid and fetal movement were reviewed in detail with the patient. Please refer to After Visit Summary for other counseling recommendations.  Return in about 1 week (around 07/10/2018) for 1wk rob.[place] Bing, MD

## 2018-07-04 LAB — CERVICOVAGINAL ANCILLARY ONLY
Chlamydia: NEGATIVE
Neisseria Gonorrhea: NEGATIVE

## 2018-07-05 LAB — STREP GP B NAA: Strep Gp B NAA: NEGATIVE

## 2018-07-07 ENCOUNTER — Encounter (HOSPITAL_COMMUNITY): Payer: Self-pay

## 2018-07-07 ENCOUNTER — Ambulatory Visit (HOSPITAL_COMMUNITY)
Admission: RE | Admit: 2018-07-07 | Discharge: 2018-07-07 | Disposition: A | Discharge status: Home / Self Care | Payer: Medicaid Other | Source: Ambulatory Visit | Attending: Obstetrics and Gynecology | Admitting: Obstetrics and Gynecology

## 2018-07-07 ENCOUNTER — Ambulatory Visit (HOSPITAL_COMMUNITY): Payer: Medicaid Other | Admitting: *Deleted

## 2018-07-07 VITALS — BP 108/56 | HR 90 | Wt 111.2 lb

## 2018-07-07 DIAGNOSIS — Z3480 Encounter for supervision of other normal pregnancy, unspecified trimester: Secondary | ICD-10-CM | POA: Diagnosis not present

## 2018-07-07 DIAGNOSIS — O26843 Uterine size-date discrepancy, third trimester: Secondary | ICD-10-CM | POA: Diagnosis not present

## 2018-07-07 DIAGNOSIS — Z362 Encounter for other antenatal screening follow-up: Secondary | ICD-10-CM | POA: Diagnosis not present

## 2018-07-07 DIAGNOSIS — Z3A36 36 weeks gestation of pregnancy: Secondary | ICD-10-CM | POA: Diagnosis not present

## 2018-07-07 DIAGNOSIS — O365990 Maternal care for other known or suspected poor fetal growth, unspecified trimester, not applicable or unspecified: Secondary | ICD-10-CM | POA: Insufficient documentation

## 2018-07-10 ENCOUNTER — Ambulatory Visit (INDEPENDENT_AMBULATORY_CARE_PROVIDER_SITE_OTHER): Payer: Medicaid Other | Admitting: Family Medicine

## 2018-07-10 VITALS — BP 102/66 | HR 88 | Wt 110.0 lb

## 2018-07-10 DIAGNOSIS — Z3483 Encounter for supervision of other normal pregnancy, third trimester: Secondary | ICD-10-CM

## 2018-07-10 DIAGNOSIS — Z3480 Encounter for supervision of other normal pregnancy, unspecified trimester: Secondary | ICD-10-CM

## 2018-07-10 DIAGNOSIS — Z3A37 37 weeks gestation of pregnancy: Secondary | ICD-10-CM

## 2018-07-10 NOTE — Progress Notes (Signed)
   PRENATAL VISIT NOTE  Subjective:  Sue Shepherd is a 28 y.o. G3P1011 at [redacted]w[redacted]d being seen today for ongoing prenatal care.  She is currently monitored for the following issues for this low-risk pregnancy and has Supervision of other normal pregnancy, antepartum; Hx of anxiety disorder; Hx of major depression; and Fundal height low for dates in third trimester on their problem list.  Patient reports no complaints.  Contractions: Irritability. Vag. Bleeding: None.  Movement: Present. Denies leaking of fluid.   The following portions of the patient's history were reviewed and updated as appropriate: allergies, current medications, past family history, past medical history, past social history, past surgical history and problem list. Problem list updated.  Objective:   Vitals:   07/10/18 1431  BP: 102/66  Pulse: 88  Weight: 110 lb (49.9 kg)    Fetal Status: Fetal Heart Rate (bpm): 138 Fundal Height: 36 cm Movement: Present  Presentation: Vertex  General:  Alert, oriented and cooperative. Patient is in no acute distress.  Skin: Skin is warm and dry. No rash noted.   Cardiovascular: Normal heart rate noted  Respiratory: Normal respiratory effort, no problems with respiration noted  Abdomen: Soft, gravid, appropriate for gestational age.  Pain/Pressure: Present     Pelvic: Cervical exam deferred        Extremities: Normal range of motion.  Edema: Trace  Mental Status: Normal mood and affect. Normal behavior. Normal judgment and thought content.   Assessment and Plan:  Pregnancy: G3P1011 at [redacted]w[redacted]d  1. Supervision of other normal pregnancy, antepartum Continue routine prenatal care.  Term labor symptoms and general obstetric precautions including but not limited to vaginal bleeding, contractions, leaking of fluid and fetal movement were reviewed in detail with the patient. Please refer to After Visit Summary for other counseling recommendations.  Return in 1 week (on  07/17/2018).  Future Appointments  Date Time Provider Department Center  07/17/2018  8:45 AM Reva Bores, MD [facility]  07/24/2018  3:00 PM Anyanwu, Jethro Bastos, MD [facility]    Reva Bores, MD

## 2018-07-10 NOTE — Patient Instructions (Signed)

## 2018-07-15 ENCOUNTER — Telehealth: Payer: Self-pay | Admitting: *Deleted

## 2018-07-15 MED ORDER — OSELTAMIVIR PHOSPHATE 75 MG PO CAPS: 75.0000 mg | ORAL_CAPSULE | Freq: Every day | ORAL | 0 refills | Status: DC

## 2018-07-15 NOTE — Telephone Encounter (Signed)
Informed of Tamiflu RX being sent in

## 2018-07-17 ENCOUNTER — Other Ambulatory Visit: Payer: Self-pay

## 2018-07-17 ENCOUNTER — Ambulatory Visit (INDEPENDENT_AMBULATORY_CARE_PROVIDER_SITE_OTHER): Payer: Medicaid Other | Admitting: Family Medicine

## 2018-07-17 VITALS — BP 92/61 | HR 78 | Wt 108.6 lb

## 2018-07-17 DIAGNOSIS — Z3A38 38 weeks gestation of pregnancy: Secondary | ICD-10-CM

## 2018-07-17 DIAGNOSIS — Z3483 Encounter for supervision of other normal pregnancy, third trimester: Secondary | ICD-10-CM

## 2018-07-17 DIAGNOSIS — Z3480 Encounter for supervision of other normal pregnancy, unspecified trimester: Secondary | ICD-10-CM

## 2018-07-17 NOTE — Progress Notes (Signed)
   PRENATAL VISIT NOTE  Subjective:  Sue Shepherd is a 28 y.o. G3P1011 at [redacted]w[redacted]d being seen today for ongoing prenatal care.  She is currently monitored for the following issues for this low-risk pregnancy and has Supervision of other normal pregnancy, antepartum; Hx of anxiety disorder; and Hx of major depression on their problem list.  Patient reports no complaints.  Contractions: Irritability. Vag. Bleeding: None.  Movement: Present. Denies leaking of fluid.   The following portions of the patient's history were reviewed and updated as appropriate: allergies, current medications, past family history, past medical history, past social history, past surgical history and problem list.   Objective:   Vitals:   07/17/18 1103 07/17/18 1104  BP: 92/61 92/61  Pulse:  78  Weight: 108 lb 9.6 oz (49.3 kg) 108 lb 9.6 oz (49.3 kg)    Fetal Status: Fetal Heart Rate (bpm): 141 Fundal Height: 32 cm Movement: Present  Presentation: Vertex  General:  Alert, oriented and cooperative. Patient is in no acute distress.  Skin: Skin is warm and dry. No rash noted.   Cardiovascular: Normal heart rate noted  Respiratory: Normal respiratory effort, no problems with respiration noted  Abdomen: Soft, gravid, appropriate for gestational age.  Pain/Pressure: Present     Pelvic: Cervical exam performed Dilation: 2 Effacement (%): 50 Station: -3  Extremities: Normal range of motion.  Edema: Trace  Mental Status: Normal mood and affect. Normal behavior. Normal judgment and thought content.   Assessment and Plan:  Pregnancy: G3P1011 at [redacted]w[redacted]d 1. Supervision of other normal pregnancy, antepartum Continue routine prenatal care.   Term labor symptoms and general obstetric precautions including but not limited to vaginal bleeding, contractions, leaking of fluid and fetal movement were reviewed in detail with the patient. Please refer to After Visit Summary for other counseling recommendations.   Return in 1 week  (on 07/24/2018).  Future Appointments  Date Time Provider Department Center  07/24/2018  3:00 PM Anyanwu, Jethro Bastos, MD [facility]    Reva Bores, MD

## 2018-07-17 NOTE — Patient Instructions (Signed)

## 2018-07-24 ENCOUNTER — Ambulatory Visit (INDEPENDENT_AMBULATORY_CARE_PROVIDER_SITE_OTHER): Payer: Medicaid Other | Admitting: Obstetrics & Gynecology

## 2018-07-24 ENCOUNTER — Other Ambulatory Visit: Payer: Self-pay

## 2018-07-24 VITALS — BP 100/71 | HR 96 | Wt 107.0 lb

## 2018-07-24 DIAGNOSIS — O26843 Uterine size-date discrepancy, third trimester: Secondary | ICD-10-CM

## 2018-07-24 DIAGNOSIS — Z3480 Encounter for supervision of other normal pregnancy, unspecified trimester: Secondary | ICD-10-CM

## 2018-07-24 DIAGNOSIS — Z3A39 39 weeks gestation of pregnancy: Secondary | ICD-10-CM

## 2018-07-24 NOTE — Progress Notes (Signed)
PRENATAL VISIT NOTE  Subjective:  Sue KidneyBeverly Blucher is a 28 y.o. G3P1011 at 2735w1d being seen today for ongoing prenatal care.  She is currently monitored for the following issues for this low-risk pregnancy and has Supervision of other normal pregnancy, antepartum; Hx of anxiety disorder; and Hx of major depression on their problem list.  Patient reports occasional contractions.  Contractions: Irritability. Vag. Bleeding: None.  Movement: Present. Denies leaking of fluid.   The following portions of the patient's history were reviewed and updated as appropriate: allergies, current medications, past family history, past medical history, past social history, past surgical history and problem list.   Objective:   Vitals:   07/24/18 1502  BP: 100/71  Pulse: 96  Weight: 107 lb (48.5 kg)    Fetal Status: Fetal Heart Rate (bpm): 152 Fundal Height: 34 cm Movement: Present  Presentation: Vertex  General:  Alert, oriented and cooperative. Patient is in no acute distress.  Skin: Skin is warm and dry. No rash noted.   Cardiovascular: Normal heart rate noted  Respiratory: Normal respiratory effort, no problems with respiration noted  Abdomen: Soft, gravid, appropriate for gestational age.  Pain/Pressure: Present     Pelvic: Cervical exam performed Dilation: 4 Effacement (%): 50 Station: -3  Extremities: Normal range of motion.  Edema: Trace  Mental Status: Normal mood and affect. Normal behavior. Normal judgment and thought content.   Imaging: [place] Mfm Ob Follow Up  Result Date: 07/08/2018 ----------------------------------------------------------------------  OBSTETRICS REPORT                         (Signed Final 07/08/2018 10:21 am) ---------------------------------------------------------------------- Patient Info  ID #:        [id number]                          D.O.B.:  07/29/1990 (27 yrs)  Name:        Sue Shepherd                  Visit Date: 07/07/2018 03:24 pm  ---------------------------------------------------------------------- Performed By  Performed By:      Percell BostonHeather Waken,         [facility]    [facility]  Attending:         Lin Landsmanorenthian Booker      Address:  [address]                     [place]                                        [address]                                                               [address]                                                               [place]                                                               [address]  Referred By:       Roxy Cedar             [facility]                                  Fetal Care ---------------------------------------------------------------------- Orders   #  Description                           Code         Ordered By   1  [place] MFM OB FOLLOW UP                   58309.40     [place]  ----------------------------------------------------------------------   #  Order #                     Accession #                 Episode #   1  [id number]                   [id number]                  [id number]  ---------------------------------------------------------------------- Indications   Encounter for other antenatal screening         Z36.2   follow-up   Uterine size-date discrepancy, third trimester  O26.843   (S<D)   [redacted] weeks gestation of pregnancy                 Z3A.36  ---------------------------------------------------------------------- Fetal Evaluation  Num Of Fetuses:          1  Fetal Heart Rate(bpm):   131  Cardiac  Activity:        Observed  Presentation:            Cephalic  Placenta:                Anterior  P. Cord Insertion:       Previously Visualized  Amniotic Fluid  AFI FV:      Within normal limits  AFI  Sum(cm)     %Tile       Largest Pocket(cm)  11.67           35          4.54  RUQ(cm)       RLQ(cm)        LUQ(cm)        LLQ(cm)  3.36          0.28           4.54           3.49 ---------------------------------------------------------------------- Biometry  BPD:      88.2   mm     G. Age:  35w 5d         45  %    CI:         74.04  %    70 - 86                                                           FL/HC:       19.8  %    20.1 - 22.1  HC:      325.5   mm     G. Age:  36w 6d         38  %    HC/AC:       0.95       0.93 - 1.11  AC:      343.5   mm     G. Age:  38w 2d         97  %    FL/BPD:      73.0  %    71 - 87  FL:       64.4   mm     G. Age:  33w 2d        < 3  %    FL/AC:       18.7  %    20 - 24  HUM:      55.7   mm     G. Age:  32w 3d        < 5  %  Est. FW:    2991   gm    6 lb 10 oz      75  % ---------------------------------------------------------------------- OB History  Gravidity:     3         Term:  1          Prem:  0        SAB:   1  TOP:           0       Ectopic: 0         Living: 1 ---------------------------------------------------------------------- Gestational Age  LMP:            36w 5d  Date:  10/23/17                   EDD:  07/30/18  U/S Today:      36w 0d                                         EDD:  08/04/18  Best:           36w 1d    Det. By:  U/S  (03/05/18)            EDD:  08/03/18 ---------------------------------------------------------------------- Anatomy  Cranium:                Appears normal         LVOT:                   Previously seen  Cavum:                  Appears normal         Aortic Arch:            Previously seen  Ventricles:             Appears normal         Ductal Arch:            Previously seen  Choroid Plexus:         CPC previously         Diaphragm:              Previously seen                          visualized  Cerebellum:             Appears normal         Stomach:                Appears normal, left                                                                          sided  Posterior Fossa:        Appears normal         Abdomen:                Appears normal  Nuchal Fold:            Previously seen        Abdominal Wall:         Previously seen  Face:                   Appears normal         Cord Vessels:           Previously seen                          (orbits and profile)  Lips:                   Appears normal  Kidneys:                Appear normal  Palate:                 Previously seen        Bladder:                Appears normal  Thoracic:               Appears normal         Spine:                  Previously seen  Heart:                  Previously seen        Upper Extremities:      Previously seen  RVOT:                   Previously seen        Lower Extremities:      Previously seen  Other:   Heels and 5th digit previously visualized. ---------------------------------------------------------------------- Cervix Uterus Adnexa  Cervix  Not visualized (advanced GA >24wks)  Uterus  No abnormality visualized.  Cul De Sac  No free fluid seen.  Adnexa  No abnormality visualized. ---------------------------------------------------------------------- Impression  Normal interval growth. ---------------------------------------------------------------------- Recommendations  Follow up as clinically indicated ----------------------------------------------------------------------               Sue Landsman, MD Electronically Signed Final Report   07/08/2018 10:21 am ----------------------------------------------------------------------   Assessment and Plan:  Pregnancy: G3P1011 at [redacted]w[redacted]d 1. Fundal height low for dates in third trimester Still measuring very low. EFW 75% on 07/10/2018, repeat scan ordered for next week.  - [place] MFM OB FOLLOW UP; Future  2. Supervision of other normal pregnancy, antepartum Will be postdates soon, BPP also ordered. Will have NST here next week if still pregnant. Very favorable cervical exam. IOL  scheduled at 41 weeks (if needed), orders placed.  - [place] MFM FETAL BPP WO NON STRESS; Future Term labor symptoms and general obstetric precautions including but not limited to vaginal bleeding, contractions, leaking of fluid and fetal movement were reviewed in detail with the patient. Please refer to After Visit Summary for other counseling recommendations.   Return in about 1 week (around 07/31/2018) for NST, OB Visit (will have BPP at MFM).  Future Appointments  Date Time Provider Department Center  07/31/2018 12:45 PM [facility] [place] 2 [facility]    Jaynie Collins, MD

## 2018-07-24 NOTE — Patient Instructions (Addendum)
Return to office for any scheduled appointments. Call the office or go to the MAU at [facility] if:  You begin to have strong, frequent contractions  Your water breaks.  Sometimes it is a big gush of fluid, sometimes it is just a trickle that keeps getting your panties wet or running down your legs  You have vaginal bleeding.  It is normal to have a small amount of spotting if your cervix was checked.   You do not feel your baby moving like normal.  If you do not, get something to eat and drink and lay down and focus on feeling your baby move.   If your baby is still not moving like normal, you should call the office or go to MAU.  Any other obstetric concerns.     Labor Induction  Labor induction is when steps are taken to cause a pregnant woman to begin the labor process. Most women go into labor on their own between 37 weeks and 42 weeks of pregnancy. When this does not happen or when there is a medical need for labor to begin, steps may be taken to induce labor. Labor induction causes a pregnant woman's uterus to contract. It also causes the cervix to soften (ripen), open (dilate), and thin out (efface). Usually, labor is not induced before 39 weeks of pregnancy unless there is a medical reason to do so. Your health care provider will determine if labor induction is needed. Before inducing labor, your health care provider will consider a number of factors, including:  Your medical condition and your baby's.  How many weeks along you are in your pregnancy.  How mature your baby's lungs are.  The condition of your cervix.  The position of your baby.  The size of your birth canal. What are some reasons for labor induction? Labor may be induced if:  Your health or your baby's health is at risk.  Your pregnancy is overdue by 1 week or more.  Your water breaks but labor does not start on its own.  There is a low amount of amniotic fluid around your  baby. You may also choose (elect) to have labor induced at a certain time. Generally, elective labor induction is done no earlier than 39 weeks of pregnancy. What methods are used for labor induction? Methods used for labor induction include:  Prostaglandin medicine. This medicine starts contractions and causes the cervix to dilate and ripen. It can be taken by mouth (orally) or by being inserted into the vagina (suppository).  Inserting a small, thin tube (catheter) with a balloon into the vagina and then expanding the balloon with water to dilate the cervix.  Stripping the membranes. In this method, your health care provider gently separates amniotic sac tissue from the cervix. This causes the cervix to stretch, which in turn causes the release of a hormone called progesterone. The hormone causes the uterus to contract. This procedure is often done during an office visit, after which you will be sent home to wait for contractions to begin.  Breaking the water. In this method, your health care provider uses a small instrument to make a small hole in the amniotic sac. This eventually causes the amniotic sac to break. Contractions should begin after a few hours.  Medicine to trigger or strengthen contractions. This medicine is given through an IV that is inserted into a vein in your arm. Except for membrane stripping, which can be done in a   clinic, labor induction is done in the hospital so that you and your baby can be carefully monitored. How long does it take for labor to be induced? The length of time it takes to induce labor depends on how ready your body is for labor. Some inductions can take up to 2-3 days, while others may take less than a day. Induction may take longer if:  You are induced early in your pregnancy.  It is your first pregnancy.  Your cervix is not ready. What are some risks associated with labor induction? Some risks associated with labor induction include:  Changes  in fetal heart rate, such as being too high, too low, or irregular (erratic).  Failed induction.  Infection in the mother or the baby.  Increased risk of having a cesarean delivery.  Fetal death.  Breaking off (abruption) of the placenta from the uterus (rare).  Rupture of the uterus (very rare). When induction is needed for medical reasons, the benefits of induction generally outweigh the risks. What are some reasons for not inducing labor? Labor induction should not be done if:  Your baby does not tolerate contractions.  You have had previous surgeries on your uterus, such as a myomectomy, removal of fibroids, or a vertical scar from a previous cesarean delivery.  Your placenta lies very low in your uterus and blocks the opening of the cervix (placenta previa).  Your baby is not in a head-down position.  The umbilical cord drops down into the birth canal in front of the baby.  There are unusual circumstances, such as the baby being very early (premature).  You have had more than 2 previous cesarean deliveries. Summary  Labor induction is when steps are taken to cause a pregnant woman to begin the labor process.  Labor induction causes a pregnant woman's uterus to contract. It also causes the cervix to ripen, dilate, and efface.  Labor is not induced before 39 weeks of pregnancy unless there is a medical reason to do so.  When induction is needed for medical reasons, the benefits of induction generally outweigh the risks. This information is not intended to replace advice given to you by your health care provider. Make sure you discuss any questions you have with your health care provider. Document Released: 09/12/2006 Document Revised: 06/06/2016 Document Reviewed: 06/06/2016 Elsevier Interactive Patient Education  2019 [employer] Inc[employer]  

## 2018-07-25 ENCOUNTER — Inpatient Hospital Stay (HOSPITAL_COMMUNITY): Payer: Medicaid Other

## 2018-07-25 ENCOUNTER — Encounter (HOSPITAL_COMMUNITY): Payer: Self-pay | Admitting: *Deleted

## 2018-07-25 ENCOUNTER — Other Ambulatory Visit: Payer: Self-pay

## 2018-07-25 ENCOUNTER — Inpatient Hospital Stay (HOSPITAL_COMMUNITY)
Admission: AD | Admit: 2018-07-25 | Discharge: 2018-07-25 | Disposition: A | Discharge status: Home / Self Care | Payer: Medicaid Other | Attending: Obstetrics & Gynecology | Admitting: Obstetrics & Gynecology

## 2018-07-25 ENCOUNTER — Telehealth (HOSPITAL_COMMUNITY): Payer: Self-pay | Admitting: *Deleted

## 2018-07-25 DIAGNOSIS — R05 Cough: Secondary | ICD-10-CM

## 2018-07-25 DIAGNOSIS — Z79899 Other long term (current) drug therapy: Secondary | ICD-10-CM | POA: Diagnosis not present

## 2018-07-25 DIAGNOSIS — Z833 Family history of diabetes mellitus: Secondary | ICD-10-CM | POA: Diagnosis not present

## 2018-07-25 DIAGNOSIS — O99513 Diseases of the respiratory system complicating pregnancy, third trimester: Secondary | ICD-10-CM | POA: Insufficient documentation

## 2018-07-25 DIAGNOSIS — Z3689 Encounter for other specified antenatal screening: Secondary | ICD-10-CM | POA: Insufficient documentation

## 2018-07-25 DIAGNOSIS — Z87891 Personal history of nicotine dependence: Secondary | ICD-10-CM | POA: Insufficient documentation

## 2018-07-25 DIAGNOSIS — Z818 Family history of other mental and behavioral disorders: Secondary | ICD-10-CM | POA: Diagnosis not present

## 2018-07-25 DIAGNOSIS — F419 Anxiety disorder, unspecified: Secondary | ICD-10-CM | POA: Insufficient documentation

## 2018-07-25 DIAGNOSIS — Z3A39 39 weeks gestation of pregnancy: Secondary | ICD-10-CM | POA: Diagnosis not present

## 2018-07-25 DIAGNOSIS — O26893 Other specified pregnancy related conditions, third trimester: Secondary | ICD-10-CM

## 2018-07-25 DIAGNOSIS — J0100 Acute maxillary sinusitis, unspecified: Secondary | ICD-10-CM | POA: Insufficient documentation

## 2018-07-25 DIAGNOSIS — R059 Cough, unspecified: Secondary | ICD-10-CM

## 2018-07-25 DIAGNOSIS — F329 Major depressive disorder, single episode, unspecified: Secondary | ICD-10-CM | POA: Insufficient documentation

## 2018-07-25 DIAGNOSIS — O99343 Other mental disorders complicating pregnancy, third trimester: Secondary | ICD-10-CM | POA: Diagnosis not present

## 2018-07-25 LAB — URINALYSIS, ROUTINE W REFLEX MICROSCOPIC
Bilirubin Urine: NEGATIVE
Glucose, UA: NEGATIVE mg/dL
Hgb urine dipstick: NEGATIVE
Ketones, ur: NEGATIVE mg/dL
Leukocytes,Ua: NEGATIVE
Nitrite: NEGATIVE
Protein, ur: NEGATIVE mg/dL
Specific Gravity, Urine: 1.021 (ref 1.005–1.030)
pH: 6 (ref 5.0–8.0)

## 2018-07-25 MED ORDER — AMOXICILLIN-POT CLAVULANATE 875-125 MG PO TABS: 1.0000 | ORAL_TABLET | Freq: Two times a day (BID) | ORAL | 0 refills | Status: DC

## 2018-07-25 MED ORDER — BUTALBITAL-APAP-CAFFEINE 50-325-40 MG PO TABS
1.0000 | ORAL_TABLET | Freq: Once | ORAL | Status: AC
  Administered 2018-07-25: 1 via ORAL
  Filled 2018-07-25: qty 1

## 2018-07-25 NOTE — Telephone Encounter (Signed)
Preadmission screen  

## 2018-07-25 NOTE — MAU Provider Note (Signed)
History     CSN: [id number]  Arrival date and time: 07/25/18 1336   First Provider Initiated Contact with Patient 07/25/18 1410      Chief Complaint  Patient presents with  . Headache  . Nasal Congestion  . Cough   G3P1011 @39 .2 wks presenting with HA. Reports onset 4 days ago. HA located frontal and right temporal. Rates 8/10. She took Tylenol but didn't help. Endorses productive cough x4 days. Sputum is green and thick. Denies fevers, body aches, SOB. Partner had flu last week, she took Tamiflu prophylactically. Reports pain with urination x2 week. Cannot describe pain but resolves after bladder is empty. No known exposure to COVID-19, no recent travel.   OB History    Gravida  3   Para  1   Term  1   Preterm      AB  1   Living  1     SAB  1   TAB      Ectopic      Multiple      Live Births  1           Past Medical History:  Diagnosis Date  . Anxiety   . Depression     Past Surgical History:  Procedure Laterality Date  . WISDOM TOOTH EXTRACTION      Family History  Problem Relation Age of Onset  . Arthritis Paternal Grandfather   . Anxiety disorder Paternal Grandfather   . Heart disease Paternal Grandfather   . Anxiety disorder Sister   . Depression Sister   . Crohn's disease Paternal Uncle   . Diabetes Maternal Grandfather   . Dementia Paternal Grandmother     Social History   Tobacco Use  . Smoking status: Former Smoker    Types: Cigarettes    Last attempt to quit: 12/04/2017    Years since quitting: 0.6  . Smokeless tobacco: Never Used  Substance Use Topics  . Alcohol use: Not Currently  . Drug use: No    Allergies: No Known Allergies  Medications Prior to Admission  Medication Sig Dispense Refill Last Dose  . cyclobenzaprine (FLEXERIL) 10 MG tablet Take 1 tablet (10 mg total) by mouth every 8 (eight) hours as needed for muscle spasms. (Patient not taking: Reported on 06/05/2018) 30 tablet 1 Not Taking  . oseltamivir  (TAMIFLU) 75 MG capsule Take 1 capsule (75 mg total) by mouth daily. 7 capsule 0   . Prenatal MV-Min-FA-Omega-3 (PRENATAL GUMMIES/DHA & FA PO) Take by mouth daily.   Taking    Review of Systems  Constitutional: Negative for chills and fever.  HENT: Positive for congestion, ear pain, rhinorrhea and sinus pain. Negative for sore throat.   Respiratory: Positive for cough. Negative for shortness of breath.   Gastrointestinal: Positive for abdominal pain.  Genitourinary: Positive for dysuria. Negative for frequency, urgency, vaginal bleeding and vaginal discharge.  Musculoskeletal: Negative for arthralgias, back pain and myalgias.  Neurological: Positive for headaches. Negative for dizziness and light-headedness.   Physical Exam   Blood pressure 99/70, pulse (!) 109, temperature 97.7 F (36.5 C), temperature source Oral, resp. rate 18, height 4\' 11"  (1.499 m), weight 48.7 kg, last menstrual period 10/23/2017, SpO2 98 %, unknown if currently breastfeeding.  Physical Exam  Nursing note and vitals reviewed. Constitutional: She is oriented to person, place, and time. She appears well-developed and well-nourished. No distress.  HENT:  Head: Normocephalic and atraumatic.  Right Ear: Hearing, tympanic membrane and ear canal normal. There is  tenderness.  Left Ear: Hearing, tympanic membrane and ear canal normal. There is tenderness.  Nose: Right sinus exhibits maxillary sinus tenderness. Right sinus exhibits no frontal sinus tenderness. Left sinus exhibits no maxillary sinus tenderness and no frontal sinus tenderness.  Mouth/Throat: Uvula is midline, oropharynx is clear and moist and mucous membranes are normal.  Neck: Normal range of motion.  Cardiovascular: Normal rate, regular rhythm and normal heart sounds.  Mild tachy  Respiratory: Effort normal. No respiratory distress. She has no wheezes. She has rhonchi in the right middle field and the left lower field.  GI: Soft. She exhibits no  distension. There is no abdominal tenderness.  gravid  Musculoskeletal: Normal range of motion.  Neurological: She is alert and oriented to person, place, and time.  Skin: Skin is warm and dry.  Psychiatric: She has a normal mood and affect.  EFM: 145 bpm, mod variability, + accels, no decels Toco: rare  Results for orders placed or performed during the hospital encounter of 07/25/18 (from the past 24 hour(s))  Urinalysis, Routine w reflex microscopic     Status: None   Collection Time: 07/25/18  2:40 PM  Result Value Ref Range   Color, Urine YELLOW YELLOW   APPearance CLEAR CLEAR   Specific Gravity, Urine 1.021 1.005 - 1.030   pH 6.0 5.0 - 8.0   Glucose, UA NEGATIVE NEGATIVE mg/dL   Hgb urine dipstick NEGATIVE NEGATIVE   Bilirubin Urine NEGATIVE NEGATIVE   Ketones, ur NEGATIVE NEGATIVE mg/dL   Protein, ur NEGATIVE NEGATIVE mg/dL   Nitrite NEGATIVE NEGATIVE   Leukocytes,Ua NEGATIVE NEGATIVE   Dg Chest 2 View  Result Date: 07/25/2018 CLINICAL DATA:  Cough. EXAM: CHEST - 2 VIEW COMPARISON:  Thoracic spine x-rays dated September 01, 2010. FINDINGS: The heart size and mediastinal contours are within normal limits. Both lungs are clear. The visualized skeletal structures are unremarkable. IMPRESSION: No active cardiopulmonary disease. Electronically Signed   By: Obie Dredge M.D.   On: 07/25/2018 15:14   MAU Course  Procedures Orders Placed This Encounter  Procedures  . DG Chest 2 View    Standing Status:   Standing    Number of Occurrences:   1    Order Specific Question:   Symptom/Reason for Exam    Answer:   Cough [612.2.ICD-9-CM]    Order Specific Question:   Radiology Contrast Protocol - do NOT remove file path    Answer:   [email or web address]  . Urinalysis, Routine w reflex microscopic    Standing Status:   Standing    Number of Occurrences:   1  . Discharge patient    Order Specific Question:   Discharge disposition    Answer:    01-Home or Self Care [1]    Order Specific Question:   Discharge patient date    Answer:   07/25/2018   Meds ordered this encounter  Medications  . butalbital-acetaminophen-caffeine (FIORICET, ESGIC) 50-325-40 MG per tablet 1 tablet  . amoxicillin-clavulanate (AUGMENTIN) 875-125 MG tablet    Sig: Take 1 tablet by mouth 2 (two) times daily.    Dispense:  14 tablet    Refill:  0    Order Specific Question:   Supervising Provider    Answer:   Conan Bowens [[id number]]   MDM Labs and xray ordered and reviewed. No evidence of pneumonia or influenza. Will treat Sinusitis, likely cause of HA. No evidence of UTI. Stable for discharge home.  Assessment and Plan   1. [redacted] weeks gestation  of pregnancy   2. Cough   3. NST (non-stress test) reactive   4. Acute non-recurrent maxillary sinusitis    Discharge home Follow up at [facility] as scheduled Rx Augmentin Notify MD for worsening sx  Allergies as of 07/25/2018   No Known Allergies     Medication List    TAKE these medications   amoxicillin-clavulanate 875-125 MG tablet Commonly known as:  Augmentin Take 1 tablet by mouth 2 (two) times daily.   cyclobenzaprine 10 MG tablet Commonly known as:  FLEXERIL Take 1 tablet (10 mg total) by mouth every 8 (eight) hours as needed for muscle spasms.   oseltamivir 75 MG capsule Commonly known as:  Tamiflu Take 1 capsule (75 mg total) by mouth daily.   PRENATAL GUMMIES/DHA & FA PO Take by mouth daily.      Donette Larry, CNM 07/25/2018, 3:45 PM

## 2018-07-25 NOTE — Discharge Instructions (Signed)
Sinusitis, Adult  Sinusitis is inflammation of your sinuses. Sinuses are hollow spaces in the bones around your face. Your sinuses are located:   Around your eyes.   In the middle of your forehead.   Behind your nose.   In your cheekbones.  Mucus normally drains out of your sinuses. When your nasal tissues become inflamed or swollen, mucus can become trapped or blocked. This allows bacteria, viruses, and fungi to grow, which leads to infection. Most infections of the sinuses are caused by a virus.  Sinusitis can develop quickly. It can last for up to 4 weeks (acute) or for more than 12 weeks (chronic). Sinusitis often develops after a cold.  What are the causes?  This condition is caused by anything that creates swelling in the sinuses or stops mucus from draining. This includes:   Allergies.   Asthma.   Infection from bacteria or viruses.   Deformities or blockages in your nose or sinuses.   Abnormal growths in the nose (nasal polyps).   Pollutants, such as chemicals or irritants in the air.   Infection from fungi (rare).  What increases the risk?  You are more likely to develop this condition if you:   Have a weak body defense system (immune system).   Do a lot of swimming or diving.   Overuse nasal sprays.   Smoke.  What are the signs or symptoms?  The main symptoms of this condition are pain and a feeling of pressure around the affected sinuses. Other symptoms include:   Stuffy nose or congestion.   Thick drainage from your nose.   Swelling and warmth over the affected sinuses.   Headache.   Upper toothache.   A cough that may get worse at night.   Extra mucus that collects in the throat or the back of the nose (postnasal drip).   Decreased sense of smell and taste.   Fatigue.   A fever.   Sore throat.   Bad breath.  How is this diagnosed?  This condition is diagnosed based on:   Your symptoms.   Your medical history.   A physical exam.   Tests to find out if your condition is  acute or chronic. This may include:  ? Checking your nose for nasal polyps.  ? Viewing your sinuses using a device that has a light (endoscope).  ? Testing for allergies or bacteria.  ? Imaging tests, such as an MRI or CT scan.  In rare cases, a bone biopsy may be done to rule out more serious types of fungal sinus disease.  How is this treated?  Treatment for sinusitis depends on the cause and whether your condition is chronic or acute.   If caused by a virus, your symptoms should go away on their own within 10 days. You may be given medicines to relieve symptoms. They include:  ? Medicines that shrink swollen nasal passages (topical intranasal decongestants).  ? Medicines that treat allergies (antihistamines).  ? A spray that eases inflammation of the nostrils (topical intranasal corticosteroids).  ? Rinses that help get rid of thick mucus in your nose (nasal saline washes).   If caused by bacteria, your health care provider may recommend waiting to see if your symptoms improve. Most bacterial infections will get better without antibiotic medicine. You may be given antibiotics if you have:  ? A severe infection.  ? A weak immune system.   If caused by narrow nasal passages or nasal polyps, you may need   to have surgery.  Follow these instructions at home:  Medicines   Take, use, or apply over-the-counter and prescription medicines only as told by your health care provider. These may include nasal sprays.   If you were prescribed an antibiotic medicine, take it as told by your health care provider. Do not stop taking the antibiotic even if you start to feel better.  Hydrate and humidify     Drink enough fluid to keep your urine pale yellow. Staying hydrated will help to thin your mucus.   Use a cool mist humidifier to keep the humidity level in your home above 50%.   Inhale steam for 10-15 minutes, 3-4 times a day, or as told by your health care provider. You can do this in the bathroom while a hot shower is  running.   Limit your exposure to cool or dry air.  Rest   Rest as much as possible.   Sleep with your head raised (elevated).   Make sure you get enough sleep each night.  General instructions     Apply a warm, moist washcloth to your face 3-4 times a day or as told by your health care provider. This will help with discomfort.   Wash your hands often with soap and water to reduce your exposure to germs. If soap and water are not available, use hand sanitizer.   Do not smoke. Avoid being around people who are smoking (secondhand smoke).   Keep all follow-up visits as told by your health care provider. This is important.  Contact a health care provider if:   You have a fever.   Your symptoms get worse.   Your symptoms do not improve within 10 days.  Get help right away if:   You have a severe headache.   You have persistent vomiting.   You have severe pain or swelling around your face or eyes.   You have vision problems.   You develop confusion.   Your neck is stiff.   You have trouble breathing.  Summary   Sinusitis is soreness and inflammation of your sinuses. Sinuses are hollow spaces in the bones around your face.   This condition is caused by nasal tissues that become inflamed or swollen. The swelling traps or blocks the flow of mucus. This allows bacteria, viruses, and fungi to grow, which leads to infection.   If you were prescribed an antibiotic medicine, take it as told by your health care provider. Do not stop taking the antibiotic even if you start to feel better.   Keep all follow-up visits as told by your health care provider. This is important.  This information is not intended to replace advice given to you by your health care provider. Make sure you discuss any questions you have with your health care provider.  Document Released: 04/23/2005 Document Revised: 09/23/2017 Document Reviewed: 09/23/2017  Elsevier Interactive Patient Education  2019 [employer]

## 2018-07-25 NOTE — MAU Note (Signed)
Pt C/O HA x 4 days, tylenol doesn't help.  Also took claritin, thinks she may have a sinus infection.  Has nasal congestion & cough, denies sore throat or fever.  States the whole R side of her face is sore.  Membranes were stripped yesterday @ office, pt having some bloody mucus.  Having occasional contractions, denies bright red bleeding or LOF.  Reports good fetal movement.

## 2018-07-26 ENCOUNTER — Encounter (HOSPITAL_COMMUNITY): Payer: Self-pay

## 2018-07-26 ENCOUNTER — Inpatient Hospital Stay (HOSPITAL_COMMUNITY)
Admission: AD | Admit: 2018-07-26 | Discharge: 2018-07-26 | Disposition: A | Discharge status: Home / Self Care | Payer: Medicaid Other | Source: Home / Self Care | Attending: Obstetrics and Gynecology | Admitting: Obstetrics and Gynecology

## 2018-07-26 DIAGNOSIS — O99343 Other mental disorders complicating pregnancy, third trimester: Secondary | ICD-10-CM

## 2018-07-26 DIAGNOSIS — J329 Chronic sinusitis, unspecified: Secondary | ICD-10-CM | POA: Insufficient documentation

## 2018-07-26 DIAGNOSIS — Z87891 Personal history of nicotine dependence: Secondary | ICD-10-CM | POA: Insufficient documentation

## 2018-07-26 DIAGNOSIS — F329 Major depressive disorder, single episode, unspecified: Secondary | ICD-10-CM | POA: Insufficient documentation

## 2018-07-26 DIAGNOSIS — Z833 Family history of diabetes mellitus: Secondary | ICD-10-CM

## 2018-07-26 DIAGNOSIS — F419 Anxiety disorder, unspecified: Secondary | ICD-10-CM | POA: Insufficient documentation

## 2018-07-26 DIAGNOSIS — Z79899 Other long term (current) drug therapy: Secondary | ICD-10-CM | POA: Insufficient documentation

## 2018-07-26 DIAGNOSIS — Z3A39 39 weeks gestation of pregnancy: Secondary | ICD-10-CM

## 2018-07-26 DIAGNOSIS — Z818 Family history of other mental and behavioral disorders: Secondary | ICD-10-CM

## 2018-07-26 DIAGNOSIS — O9989 Other specified diseases and conditions complicating pregnancy, childbirth and the puerperium: Secondary | ICD-10-CM | POA: Insufficient documentation

## 2018-07-26 DIAGNOSIS — R51 Headache: Principal | ICD-10-CM

## 2018-07-26 DIAGNOSIS — R519 Headache, unspecified: Secondary | ICD-10-CM

## 2018-07-26 LAB — URINALYSIS, ROUTINE W REFLEX MICROSCOPIC
Bilirubin Urine: NEGATIVE
Glucose, UA: NEGATIVE mg/dL
Hgb urine dipstick: NEGATIVE
Ketones, ur: NEGATIVE mg/dL
Nitrite: NEGATIVE
Protein, ur: NEGATIVE mg/dL
Specific Gravity, Urine: 1.008 (ref 1.005–1.030)
pH: 7 (ref 5.0–8.0)

## 2018-07-26 MED ORDER — ACETAMINOPHEN 500 MG PO TABS
1000.0000 mg | ORAL_TABLET | Freq: Once | ORAL | Status: AC
  Administered 2018-07-26: 1000 mg via ORAL
  Filled 2018-07-26: qty 2

## 2018-07-26 MED ORDER — OXYCODONE HCL 5 MG PO TABS
5.0000 mg | ORAL_TABLET | Freq: Once | ORAL | Status: AC
  Administered 2018-07-26: 5 mg via ORAL
  Filled 2018-07-26: qty 1

## 2018-07-26 NOTE — MAU Provider Note (Signed)
History    CSN: [id number] Arrival date and time: 07/26/18 7482  First Provider Initiated Contact with Patient 07/26/18 [phone]    Chief Complaint  Patient presents with  . Headache   HPI 28yo G3P1011 at [redacted]w[redacted]d who presents to MAU with worsening headache for last 5 days. Was seen in MAU less than 24 hours ago and diagnosed with sinus infection. States feels pressure all around sinuses. Has tried Tylenol but states stopped working. Has tried Flonase but thinks made it worse. Called after hours nurse who recommended she come to be evaluated because of headache in pregnancy. Denies blurry vision, spots in vision, abdominal pain.  Is feeling cramping in lower abdomen but no regular contractions. Denies vaginal discharge, bleeding. Reports normal fetal movement.   OB History    Gravida  3   Para  1   Term  1   Preterm      AB  1   Living  1     SAB  1   TAB      Ectopic      Multiple      Live Births  1           Past Medical History:  Diagnosis Date  . Anxiety   . Depression     Past Surgical History:  Procedure Laterality Date  . WISDOM TOOTH EXTRACTION      Family History  Problem Relation Age of Onset  . Arthritis Paternal Grandfather   . Anxiety disorder Paternal Grandfather   . Heart disease Paternal Grandfather   . Anxiety disorder Sister   . Depression Sister   . Crohn's disease Paternal Uncle   . Diabetes Maternal Grandfather   . Dementia Paternal Grandmother     Social History   Tobacco Use  . Smoking status: Former Smoker    Types: Cigarettes    Last attempt to quit: 12/04/2017    Years since quitting: 0.6  . Smokeless tobacco: Never Used  Substance Use Topics  . Alcohol use: Not Currently  . Drug use: No    Allergies: No Known Allergies  Medications Prior to Admission  Medication Sig Dispense Refill Last Dose  . amoxicillin-clavulanate (AUGMENTIN) 875-125 MG tablet Take 1 tablet by mouth 2 (two) times daily. 14 tablet 0   .  cyclobenzaprine (FLEXERIL) 10 MG tablet Take 1 tablet (10 mg total) by mouth every 8 (eight) hours as needed for muscle spasms. (Patient not taking: Reported on 06/05/2018) 30 tablet 1 Not Taking  . oseltamivir (TAMIFLU) 75 MG capsule Take 1 capsule (75 mg total) by mouth daily. 7 capsule 0   . Prenatal MV-Min-FA-Omega-3 (PRENATAL GUMMIES/DHA & FA PO) Take by mouth daily.   Taking    Review of Systems  Constitutional: Positive for fatigue. Negative for activity change and fever.  HENT: Positive for congestion, sinus pressure and sinus pain. Negative for ear pain, facial swelling, rhinorrhea and sneezing.   Eyes: Negative for visual disturbance.  Respiratory: Negative for shortness of breath.   Cardiovascular: Negative for chest pain and palpitations.  Genitourinary: Negative for dysuria, vaginal bleeding and vaginal discharge.  Musculoskeletal: Negative for back pain.  Neurological: Positive for headaches (bilateral temple).  Psychiatric/Behavioral: Negative for sleep disturbance. The patient is not nervous/anxious.    Physical Exam   Blood pressure 103/70, pulse (!) 103, temperature 97.8 F (36.6 C), resp. rate 17, height 4\' 11"  (1.499 m), weight 49 kg, last menstrual period 10/23/2017, unknown if currently breastfeeding.  Physical Exam  Nursing  note and vitals reviewed. Constitutional: She is oriented to person, place, and time. She appears well-developed and well-nourished. No distress.  HENT:  Head: Normocephalic and atraumatic.  Eyes: Conjunctivae and EOM are normal.  Cardiovascular: Normal rate.  Respiratory: Effort normal. No respiratory distress.  GI:  Gravid, soft, non-tender   Genitourinary:    Genitourinary Comments: deferred   Musculoskeletal:        General: Edema (trace) present.  Neurological: She is alert and oriented to person, place, and time. No cranial nerve deficit.  Psychiatric: She has a normal mood and affect. Her behavior is normal.    MAU Course   Procedures  MDM -- reactive and reassuring NST 140-150s/mod/+a/-d; irregular contractions every 4 to 10 minutes (though talking through contractions and does not appear uncomfortable)   Assessment and Plan  28yo G3P1011 at [redacted]w[redacted]d who presented to MAU with sinus pressure, diagnosed with sinus infection yesterday. Counseled that will likely take several days for tension to improve. Given Tylenol and one dose of oxycodone here and given instructions for care at home including Tylenol, cool compresses, Flonase/Afrin and decongestant. Encouraged to continue antibiotics. BP normal, no signs/symptoms of pregnancy induced hypertension. Reviewed return precautions, patient voiced understanding of plan and all questions answered.   Tamera Stands, DO  07/26/2018, 6:59 AM

## 2018-07-26 NOTE — Discharge Instructions (Signed)
·   Try warm showers to help your headache  A cool compress may help relieve some pressure  Drink plenty of fluids  Complete entire course of antibiotics  A nose spray like Flonase may help relieve some pressure. It is also okay to take Afrin nose spray (but only for 3 days in a row or less).   Continue to take Tylenol 1000mg  (2 extra-strength tablets) up to 3-4 times a day as needed for pain.   An antihistamine like Claritin, Zyrtec, or Benadryl may also help relieve some pressure.   It is safe to take a decongestant like Sudafed which may help things drain a little bit faster.

## 2018-07-26 NOTE — MAU Note (Signed)
Patient states she has had a headache for the past 5 days.  Was seen yesterday for a sinus infection.  Hasn't taken any medicine since leaving here yesterday.  No LOF/VB.  + FM.  Feeling occasional cramping but nothing consistent.

## 2018-07-27 ENCOUNTER — Other Ambulatory Visit: Payer: Self-pay

## 2018-07-27 ENCOUNTER — Inpatient Hospital Stay (HOSPITAL_COMMUNITY): Payer: Medicaid Other | Admitting: Anesthesiology

## 2018-07-27 ENCOUNTER — Encounter (HOSPITAL_COMMUNITY): Payer: Self-pay

## 2018-07-27 ENCOUNTER — Inpatient Hospital Stay (HOSPITAL_COMMUNITY)
Admission: AD | Admit: 2018-07-27 | Discharge: 2018-07-29 | DRG: 807 | Disposition: A | Discharge status: Home / Self Care | Payer: Medicaid Other | Attending: Family Medicine | Admitting: Family Medicine

## 2018-07-27 DIAGNOSIS — O26893 Other specified pregnancy related conditions, third trimester: Secondary | ICD-10-CM | POA: Diagnosis present

## 2018-07-27 DIAGNOSIS — J019 Acute sinusitis, unspecified: Secondary | ICD-10-CM | POA: Diagnosis present

## 2018-07-27 DIAGNOSIS — Z87891 Personal history of nicotine dependence: Secondary | ICD-10-CM

## 2018-07-27 DIAGNOSIS — O9952 Diseases of the respiratory system complicating childbirth: Secondary | ICD-10-CM | POA: Diagnosis present

## 2018-07-27 DIAGNOSIS — Z3A39 39 weeks gestation of pregnancy: Secondary | ICD-10-CM | POA: Diagnosis not present

## 2018-07-27 LAB — CBC
HCT: 36.1 % (ref 36.0–46.0)
Hemoglobin: 11.6 g/dL — ABNORMAL LOW (ref 12.0–15.0)
MCH: 28.9 pg (ref 26.0–34.0)
MCHC: 32.1 g/dL (ref 30.0–36.0)
MCV: 89.8 fL (ref 80.0–100.0)
Platelets: 405 10*3/uL — ABNORMAL HIGH (ref 150–400)
RBC: 4.02 MIL/uL (ref 3.87–5.11)
RDW: 12.8 % (ref 11.5–15.5)
WBC: 15 10*3/uL — ABNORMAL HIGH (ref 4.0–10.5)
nRBC: 0 % (ref 0.0–0.2)

## 2018-07-27 LAB — TYPE AND SCREEN
ABO/RH(D): O POS
Antibody Screen: NEGATIVE

## 2018-07-27 LAB — ABO/RH: ABO/RH(D): O POS

## 2018-07-27 MED ORDER — LIDOCAINE HCL (PF) 1 % IJ SOLN: 30.0000 mL | INTRAMUSCULAR | Status: DC | PRN

## 2018-07-27 MED ORDER — SOD CITRATE-CITRIC ACID 500-334 MG/5ML PO SOLN: 30.0000 mL | ORAL | Status: DC | PRN

## 2018-07-27 MED ORDER — SIMETHICONE 80 MG PO CHEW: 80.0000 mg | CHEWABLE_TABLET | ORAL | Status: DC | PRN

## 2018-07-27 MED ORDER — WITCH HAZEL-GLYCERIN EX PADS: 1.0000 "application " | MEDICATED_PAD | CUTANEOUS | Status: DC | PRN

## 2018-07-27 MED ORDER — DIBUCAINE 1 % RE OINT: 1.0000 "application " | TOPICAL_OINTMENT | RECTAL | Status: DC | PRN

## 2018-07-27 MED ORDER — FENTANYL-BUPIVACAINE-NACL 0.5-0.125-0.9 MG/250ML-% EP SOLN
12.0000 mL/h | EPIDURAL | Status: DC | PRN
  Filled 2018-07-27: qty 250

## 2018-07-27 MED ORDER — OXYCODONE-ACETAMINOPHEN 5-325 MG PO TABS: 2.0000 | ORAL_TABLET | ORAL | Status: DC | PRN

## 2018-07-27 MED ORDER — COCONUT OIL OIL
1.0000 "application " | TOPICAL_OIL | Status: DC | PRN
  Administered 2018-07-28: 1 via TOPICAL

## 2018-07-27 MED ORDER — PRENATAL MULTIVITAMIN CH
1.0000 | ORAL_TABLET | Freq: Every day | ORAL | Status: DC
  Administered 2018-07-28: 1 via ORAL
  Filled 2018-07-27: qty 1

## 2018-07-27 MED ORDER — IBUPROFEN 600 MG PO TABS
600.0000 mg | ORAL_TABLET | Freq: Four times a day (QID) | ORAL | Status: DC
  Administered 2018-07-27 – 2018-07-29 (×6): 600 mg via ORAL
  Filled 2018-07-27 (×6): qty 1

## 2018-07-27 MED ORDER — LACTATED RINGERS IV SOLN: 500.0000 mL | INTRAVENOUS | Status: DC | PRN

## 2018-07-27 MED ORDER — ACETAMINOPHEN 325 MG PO TABS: 650.0000 mg | ORAL_TABLET | ORAL | Status: DC | PRN

## 2018-07-27 MED ORDER — OXYTOCIN BOLUS FROM INFUSION
500.0000 mL | Freq: Once | INTRAVENOUS | Status: AC
  Administered 2018-07-27: 500 mL via INTRAVENOUS

## 2018-07-27 MED ORDER — AMOXICILLIN-POT CLAVULANATE 875-125 MG PO TABS
1.0000 | ORAL_TABLET | Freq: Two times a day (BID) | ORAL | Status: DC
  Administered 2018-07-27 – 2018-07-29 (×4): 1 via ORAL
  Filled 2018-07-27 (×4): qty 1

## 2018-07-27 MED ORDER — PHENYLEPHRINE 40 MCG/ML (10ML) SYRINGE FOR IV PUSH (FOR BLOOD PRESSURE SUPPORT)
80.0000 ug | PREFILLED_SYRINGE | INTRAVENOUS | Status: DC | PRN
  Filled 2018-07-27 (×2): qty 10

## 2018-07-27 MED ORDER — BENZOCAINE-MENTHOL 20-0.5 % EX AERO
1.0000 "application " | INHALATION_SPRAY | CUTANEOUS | Status: DC | PRN
  Administered 2018-07-28: 1 via TOPICAL
  Filled 2018-07-27: qty 56

## 2018-07-27 MED ORDER — ONDANSETRON HCL 4 MG PO TABS: 4.0000 mg | ORAL_TABLET | ORAL | Status: DC | PRN

## 2018-07-27 MED ORDER — TETANUS-DIPHTH-ACELL PERTUSSIS 5-2.5-18.5 LF-MCG/0.5 IM SUSP: 0.5000 mL | Freq: Once | INTRAMUSCULAR | Status: DC

## 2018-07-27 MED ORDER — SENNOSIDES-DOCUSATE SODIUM 8.6-50 MG PO TABS
2.0000 | ORAL_TABLET | ORAL | Status: DC
  Administered 2018-07-27 – 2018-07-28 (×2): 2 via ORAL
  Filled 2018-07-27 (×2): qty 2

## 2018-07-27 MED ORDER — OXYTOCIN 40 UNITS IN NORMAL SALINE INFUSION - SIMPLE MED
2.5000 [IU]/h | INTRAVENOUS | Status: DC
  Administered 2018-07-27: 2.5 [IU]/h via INTRAVENOUS
  Filled 2018-07-27: qty 1000

## 2018-07-27 MED ORDER — DIPHENHYDRAMINE HCL 50 MG/ML IJ SOLN: 12.5000 mg | INTRAMUSCULAR | Status: DC | PRN

## 2018-07-27 MED ORDER — FENTANYL CITRATE (PF) 100 MCG/2ML IJ SOLN: 100.0000 ug | Freq: Once | INTRAMUSCULAR | Status: DC

## 2018-07-27 MED ORDER — SODIUM CHLORIDE (PF) 0.9 % IJ SOLN
INTRAMUSCULAR | Status: DC | PRN
  Administered 2018-07-27: 14 mL/h via EPIDURAL

## 2018-07-27 MED ORDER — DOCUSATE SODIUM 100 MG PO CAPS
100.0000 mg | ORAL_CAPSULE | Freq: Two times a day (BID) | ORAL | Status: DC
  Administered 2018-07-28 – 2018-07-29 (×3): 100 mg via ORAL
  Filled 2018-07-27 (×4): qty 1

## 2018-07-27 MED ORDER — ONDANSETRON HCL 4 MG/2ML IJ SOLN: 4.0000 mg | INTRAMUSCULAR | Status: DC | PRN

## 2018-07-27 MED ORDER — FENTANYL-BUPIVACAINE-NACL 0.5-0.125-0.9 MG/250ML-% EP SOLN: 12.0000 mL/h | EPIDURAL | Status: DC | PRN

## 2018-07-27 MED ORDER — EPHEDRINE 5 MG/ML INJ
10.0000 mg | INTRAVENOUS | Status: DC | PRN
  Filled 2018-07-27: qty 2

## 2018-07-27 MED ORDER — LACTATED RINGERS IV SOLN: INTRAVENOUS | Status: DC

## 2018-07-27 MED ORDER — LACTATED RINGERS IV SOLN
INTRAVENOUS | Status: DC
  Administered 2018-07-27 (×3): via INTRAVENOUS

## 2018-07-27 MED ORDER — ONDANSETRON HCL 4 MG/2ML IJ SOLN: 4.0000 mg | Freq: Four times a day (QID) | INTRAMUSCULAR | Status: DC | PRN

## 2018-07-27 MED ORDER — OXYCODONE-ACETAMINOPHEN 5-325 MG PO TABS: 1.0000 | ORAL_TABLET | ORAL | Status: DC | PRN

## 2018-07-27 MED ORDER — DIPHENHYDRAMINE HCL 25 MG PO CAPS: 25.0000 mg | ORAL_CAPSULE | Freq: Four times a day (QID) | ORAL | Status: DC | PRN

## 2018-07-27 MED ORDER — LIDOCAINE HCL (PF) 1 % IJ SOLN
INTRAMUSCULAR | Status: DC | PRN
  Administered 2018-07-27: 6 mL via EPIDURAL

## 2018-07-27 MED ORDER — PHENYLEPHRINE 40 MCG/ML (10ML) SYRINGE FOR IV PUSH (FOR BLOOD PRESSURE SUPPORT)
80.0000 ug | PREFILLED_SYRINGE | INTRAVENOUS | Status: DC | PRN
  Filled 2018-07-27: qty 10

## 2018-07-27 MED ORDER — LACTATED RINGERS IV SOLN
500.0000 mL | Freq: Once | INTRAVENOUS | Status: AC
  Administered 2018-07-27: 500 mL via INTRAVENOUS

## 2018-07-27 NOTE — Anesthesia Procedure Notes (Signed)
Epidural Patient location during procedure: OB Start time: 07/27/2018 10:30 AM End time: 07/27/2018 10:35 AM  Staffing Anesthesiologist: Bethena Midget, MD  Preanesthetic Checklist Completed: patient identified, site marked, surgical consent, pre-op evaluation, timeout performed, IV checked, risks and benefits discussed and monitors and equipment checked  Epidural Patient position: sitting Prep: site prepped and draped and DuraPrep Patient monitoring: continuous pulse ox and blood pressure Approach: midline Location: L4-L5 Injection technique: LOR air  Needle:  Needle type: Tuohy  Needle gauge: 17 G Needle length: 9 cm and 9 Needle insertion depth: 5 cm cm Catheter type: closed end flexible Catheter size: 19 Gauge Catheter at skin depth: 10 cm Test dose: negative  Assessment Events: blood not aspirated, injection not painful, no injection resistance, negative IV test and no paresthesia

## 2018-07-27 NOTE — Progress Notes (Signed)
[facility]: Labor Progress Note  Subjective: Not feeling contractions anymore. Clarified recent Tamiflu prescription, husband was flu positive and patient was given prophylactic tamiflu. Patient also has sinus infection and started on Augmentin 3 days ago. Sinus pressure improving, also has had a cough with the infection that is improving.  Objective: BP 101/70   Pulse 99   Temp 97.6 F (36.4 C) (Oral)   Resp 18   Ht 4\' 11"  (1.499 m)   Wt 49 kg   LMP 10/23/2017 (Within Days) Comment: neg preg test 07/01/17  SpO2 94%   BMI 21.84 kg/m  Gen: well appearing, no distress Resp: CTAB, no wheezes or rhonchi Dilation: 7 Effacement (%): 90 Station: -1, 0 Presentation: Vertex Exam by:: Dr. Darin Engels  Assessment and Plan: 28 y.o. [id number] [redacted]w[redacted]d here for SOL.  Labor: AROM with clear fluid at 1209, expectant management -- pain control: epidural -- PPH Risk: low  Fetal Well-Being: EFW 2991g (75%tile) on [place] at [redacted]w[redacted]d. Cephalic by exam.  -- Category 2 w/ variables - continuous fetal monitoring  -- GBS negative  Burman Nieves, MD Family Medicine Resident 2:46 PM

## 2018-07-27 NOTE — Anesthesia Preprocedure Evaluation (Signed)
Anesthesia Evaluation  Patient identified by MRN, date of birth, ID band Patient awake    Reviewed: Allergy & Precautions, H&P , NPO status , Patient's Chart, lab work & pertinent test results, reviewed documented beta blocker date and time   Airway Mallampati: II  TM Distance: >3 FB Neck ROM: full    Dental no notable dental hx.    Pulmonary neg pulmonary ROS, former smoker,    Pulmonary exam normal breath sounds clear to auscultation       Cardiovascular negative cardio ROS Normal cardiovascular exam Rhythm:regular Rate:Normal     Neuro/Psych negative neurological ROS  negative psych ROS   GI/Hepatic negative GI ROS, Neg liver ROS,   Endo/Other  negative endocrine ROS  Renal/GU negative Renal ROS  negative genitourinary   Musculoskeletal   Abdominal   Peds  Hematology negative hematology ROS (+)   Anesthesia Other Findings   Reproductive/Obstetrics (+) Pregnancy                             Anesthesia Physical Anesthesia Plan  ASA: II  Anesthesia Plan: Epidural   Post-op Pain Management:    Induction:   PONV Risk Score and Plan: 2  Airway Management Planned:   Additional Equipment:   Intra-op Plan:   Post-operative Plan:   Informed Consent: I have reviewed the patients History and Physical, chart, labs and discussed the procedure including the risks, benefits and alternatives for the proposed anesthesia with the patient or authorized representative who has indicated his/her understanding and acceptance.     Dental Advisory Given  Plan Discussed with: Anesthesiologist and Surgeon  Anesthesia Plan Comments: (Labs checked- platelets confirmed with RN in room. Fetal heart tracing, per RN, reported to be stable enough for sitting procedure. Discussed epidural, and patient consents to the procedure:  included risk of possible headache,backache, failed block, allergic  reaction, and nerve injury. This patient was asked if she had any questions or concerns before the procedure started.)        Anesthesia Quick Evaluation

## 2018-07-27 NOTE — MAU Note (Signed)
Sue Shepherd is a 28 y.o. at [redacted]w[redacted]d here in MAU reporting: contractions since this AM, no leaking, states good movement, no bleeding  Onset of complaint: this morning  Pain score: 8/10  Vitals:   07/27/18 0933  BP: 99/71  Pulse: (!) 110  Resp: 18  Temp: 97.7 F (36.5 C)  SpO2: 94%      Lab orders placed from triage: labor eval standing orders

## 2018-07-27 NOTE — Anesthesia Postprocedure Evaluation (Signed)
Anesthesia Post Note  Patient: [name]  Procedure(s) Performed: AN AD HOC LABOR EPIDURAL     Patient location during evaluation: Mother Baby Anesthesia Type: Epidural Level of consciousness: awake and alert Pain management: pain level controlled Vital Signs Assessment: post-procedure vital signs reviewed and stable Respiratory status: spontaneous breathing, nonlabored ventilation and respiratory function stable Cardiovascular status: stable Postop Assessment: no headache, no backache and epidural receding Anesthetic complications: no    Last Vitals:  Vitals:   07/27/18 1602 07/27/18 1616  BP: 104/64 118/80  Pulse: 90 (!) 102  Resp: 16 18  Temp:    SpO2:      Last Pain:  Vitals:   07/27/18 1616  TempSrc:   PainSc: 0-No pain   Pain Goal:                Epidural/Spinal Function Cutaneous sensation: Tingles (07/27/18 1616), Patient able to flex knees: Yes (07/27/18 1616), Patient able to lift hips off bed: No (07/27/18 1616), Back pain beyond tenderness at insertion site: No (07/27/18 1616), Progressively worsening motor and/or sensory loss: No (07/27/18 1616), Bowel and/or bladder incontinence post epidural: No (07/27/18 1616)  Sue Shepherd

## 2018-07-27 NOTE — H&P (Addendum)
OBSTETRIC ADMISSION HISTORY AND PHYSICAL  Sue Shepherd is a 28 y.o. female G3P1011 with IUP at [redacted]w[redacted]d by LMP c/w 9 week scan presenting for SOL. Starting having contractions yesterday but they have become much stronger. Reports fetal movement. Denies vaginal bleeding and LOF. Denies any problems this pregnancy, no medication other than PNV.   She received her prenatal care at [facility].  Support person in labor: boyfriend  Ultrasounds . Anatomy U/S: normal, CPC  Prenatal History/Complications: . None  Past Medical History: Past Medical History:  Diagnosis Date  . Anxiety   . Depression    Past Surgical History: Past Surgical History:  Procedure Laterality Date  . WISDOM TOOTH EXTRACTION     Obstetrical History: OB History    Gravida  3   Para  1   Term  1   Preterm      AB  1   Living  1     SAB  1   TAB      Ectopic      Multiple      Live Births  1          Social History: Social History   Socioeconomic History  . Marital status: Single    Spouse name: Not on file  . Number of children: Not on file  . Years of education: Not on file  . Highest education level: Not on file  Occupational History  . Not on file  Social Needs  . Financial resource strain: Not hard at all  . Food insecurity:    Worry: Never true    Inability: Never true  . Transportation needs:    Medical: No    Non-medical: Not on file  Tobacco Use  . Smoking status: Former Smoker    Types: Cigarettes    Last attempt to quit: 12/04/2017    Years since quitting: 0.6  . Smokeless tobacco: Never Used  Substance and Sexual Activity  . Alcohol use: Not Currently  . Drug use: No  . Sexual activity: Yes    Birth control/protection: None  Lifestyle  . Physical activity:    Days per week: Not on file    Minutes per session: Not on file  . Stress: Not at all  Relationships  . Social connections:    Talks on phone: Not on file    Gets together: Not on file    Attends  religious service: Not on file    Active member of club or organization: Not on file    Attends meetings of clubs or organizations: Not on file    Relationship status: Not on file  Other Topics Concern  . Not on file  Social History Narrative  . Not on file   Family History: Family History  Problem Relation Age of Onset  . Arthritis Paternal Grandfather   . Anxiety disorder Paternal Grandfather   . Heart disease Paternal Grandfather   . Anxiety disorder Sister   . Depression Sister   . Crohn's disease Paternal Uncle   . Diabetes Maternal Grandfather   . Dementia Paternal Grandmother    Allergies: No Known Allergies  Medications Prior to Admission  Medication Sig Dispense Refill Last Dose  . amoxicillin-clavulanate (AUGMENTIN) 875-125 MG tablet Take 1 tablet by mouth 2 (two) times daily. 14 tablet 0   . oseltamivir (TAMIFLU) 75 MG capsule Take 1 capsule (75 mg total) by mouth daily. 7 capsule 0   . Prenatal MV-Min-FA-Omega-3 (PRENATAL GUMMIES/DHA & FA PO) Take  by mouth daily.   Taking   Review of Systems  All systems reviewed and negative except as stated in HPI  Blood pressure 99/71, pulse (!) 110, temperature 97.7 F (36.5 C), temperature source Oral, resp. rate 18, last menstrual period 10/23/2017, SpO2 94 %, unknown if currently breastfeeding. General appearance: severe distress during contractions Lungs: no respiratory distress Heart: regular rate  Abdomen: soft, non-tender; gravid abdomen Extremities: no LE edema Presentation: cephalic Fetal monitoring: baseline 145bpm, +accels, appears to have variable before tracing cut off Uterine activity: q2-27min Dilation: 5 Effacement (%): 90 Station: -2 Exam by:: Camelia Eng RN  Prenatal labs: ABO, Rh: O/Positive/-- (09/06 0855) Antibody: Negative (09/06 0855) Rubella: 10.40 (09/06 0855) RPR: Non Reactive (01/02 0910)  HBsAg: Negative (09/06 0855)  HIV: Non Reactive (01/02 0910)  GBS: Negative (02/27 0910)  Glucola:  normal Genetic screening:  Low risk NIPS  Prenatal Transfer Tool  Maternal Diabetes: No Genetic Screening: Normal Maternal Ultrasounds/Referrals: Normal Fetal Ultrasounds or other Referrals:  Other:  Maternal Substance Abuse:  No Significant Maternal Medications:  None Significant Maternal Lab Results: None  No results found for this or any previous visit (from the past 24 hour(s)).  Patient Active Problem List   Diagnosis Date Noted  . Indication for care in labor or delivery 07/27/2018  . Hx of anxiety disorder 02/05/2018  . Hx of major depression 02/05/2018  . Supervision of other normal pregnancy, antepartum 12/31/2017   Assessment/Plan:  Sue Shepherd is a 28 y.o. G3P1011 at [redacted]w[redacted]d here for SOL.  Labor: expectant managment -- pain control: epidural getting place now  Fetal Wellbeing: EFW 2991g (75%tile) on [place] at [redacted]w[redacted]d. Cephalic by RN check.  -- GBS (negative) -- continuous fetal monitoring - Cat 2   Postpartum Planning -- breast/unsure -- Rh+/ given Tdap   Burman Nieves, MD Family Medicine Resident    OB FELLOW HISTORY AND PHYSICAL ATTESTATION  I have seen and examined this patient; I agree with above documentation in the resident's note.   Marcy Siren, D.O. OB Fellow  07/27/2018, 3:40 PM

## 2018-07-28 LAB — RPR: RPR Ser Ql: NONREACTIVE

## 2018-07-28 NOTE — Discharge Instructions (Signed)

## 2018-07-28 NOTE — Progress Notes (Signed)
Patient ID: Sue Shepherd, female   DOB: 02-Feb-1991, 28 y.o.   MRN: [id number] POSTPARTUM PROGRESS NOTE  Post Partum Day 1  Subjective:  Sue Shepherd is a 29 y.o. [id number] s/p SVD at [redacted]w[redacted]d.  No acute events overnight.  Pt denies problems with ambulating, voiding or po intake.  She denies nausea or vomiting.  Pain is well controlled.  She has had flatus. She has not had bowel movement.  Lochia Small.   Objective: Blood pressure 112/78, pulse 86, temperature 98 F (36.7 C), resp. rate 18, height 4\' 11"  (1.499 m), weight 49 kg, last menstrual period 10/23/2017, SpO2 98 %, currently breastfeeding.  Physical Exam:  General: alert, cooperative and no distress Chest: no respiratory distress Heart:regular rate, distal pulses intact Abdomen: soft, nontender,  Uterine Fundus: firm, appropriately tender Perineum: Moderate edema; no erythema, no exudate DVT Evaluation: No calf swelling or tenderness Extremities: No edema Skin: warm, dry  Recent Labs    07/27/18 0950  HGB 11.6*  HCT 36.1    Assessment/Plan: Sue Shepherd is a 28 y.o. [id number] s/p SVD at [redacted]w[redacted]d   PPD#1 - Doing well Contraception: undecided Feeding: breast Dispo: Plan for discharge tomorrow.   LOS: 1 day   Raelyn Mora, CNM, CNM 07/28/2018, 11:36 AM

## 2018-07-28 NOTE — Progress Notes (Signed)
CSW received consult for hx of anxiety and depression.  CSW met with MOB to offer support and complete assessment.    MOB resting in bed eating breakfast and FOB bonding with infant when CSW entered the room. CSW introduced self and role and offered to come back at a later time so MOB could eat but MOB requested CSW stay. CSW received verbal permission to complete assessment with FOB present. CSW explained reason for consult and MOB nodded in understanding. CSW inquired about MOB's mental health history. Per MOB, she was diagnosed with anxiety and depression when she was 16-years-old and stopped taking medications for it when she was 18-years-old as she no longer felt it was necessary. MOB stated she did have some anxiety during her pregnancy and met with [facility] at [facility] once during her pregnancy. MOB reported visit was helpful and that her primary coping strategy is breathing. MOB denied any current mental health symptoms and denied SI/HI.   CSW provided education regarding the baby blues period vs. perinatal mood disorders, discussed treatment and gave resources for mental health follow up if concerns arise.  CSW recommends self-evaluation during the postpartum time period using the New Mom Checklist from Postpartum Progress and encouraged MOB to contact a medical professional if symptoms are noted at any time.    MOB stated she had all essential items for infant once discharged. MOB reported infant will be sleeping in a basinet. CSW provided review of Sudden Infant Death Syndrome (SIDS) precautions.    MOB and FOB denied any further questions or concerns for CSW. CSW identified no further need for intervention and no barriers to discharge at this time.  Naiyana Barbian Irwin, LCSWA  [facility] [phone]   

## 2018-07-28 NOTE — Lactation Note (Signed)
This note was copied from a baby's chart. Lactation Consultation Note  Patient Name: Sue Shepherd Today's Date: 07/28/2018   g2p2 baby Sue Odowd now 38 hours old.  Bili slightly increased.  Mom reports first baby was jaundiced and had to be under bili lights.  Mom breastfeeding on arrival.  Reports comfortable, however  Infants body turned away from mom.  Infant unlatched and asked mom if I could show her how to hand express and spoon feed.  Parents agreed.  Showed mom how to hand express and spoon feed back drops to infant.  Infant started cuing and assisted with latch on right breast.  Mom with abrasion on right nipple.  Mom reports nipples are sore.  Urged her to also hand express and put breastmilk on them and let them air dry. Mom reports she has nipple cream.  Urged her to always use her milk first. Reviewed [facility] and Understanding Mother and Baby breastfeeding part. Urged to feed on cue and 8 or more times/day. Urged parents to call lactation as needed.  Maternal Data    Feeding Feeding Type: Breast Fed  LATCH Score Latch: Grasps breast easily, tongue down, lips flanged, rhythmical sucking.  Audible Swallowing: A few with stimulation  Type of Nipple: Everted at rest and after stimulation  Comfort (Breast/Nipple): Soft / non-tender  Hold (Positioning): No assistance needed to correctly position infant at breast.  LATCH Score: 9  Interventions Interventions: Hand pump  Lactation Tools Discussed/Used     Consult Status      Neomia Dear 07/28/2018, 8:47 PM

## 2018-07-29 ENCOUNTER — Encounter: Payer: Self-pay | Admitting: Obstetrics & Gynecology

## 2018-07-29 MED ORDER — IBUPROFEN 800 MG PO TABS: 800.0000 mg | ORAL_TABLET | Freq: Three times a day (TID) | ORAL | 0 refills | Status: DC | PRN

## 2018-07-29 MED ORDER — WITCH HAZEL-GLYCERIN EX PADS: 1.0000 "application " | MEDICATED_PAD | CUTANEOUS | 12 refills | Status: DC | PRN

## 2018-07-29 MED ORDER — BENZOCAINE-MENTHOL 20-0.5 % EX AERO: 1.0000 "application " | INHALATION_SPRAY | CUTANEOUS | 0 refills | Status: DC | PRN

## 2018-07-29 MED ORDER — DOCUSATE SODIUM 100 MG PO CAPS: 100.0000 mg | ORAL_CAPSULE | Freq: Two times a day (BID) | ORAL | 0 refills | Status: DC

## 2018-07-29 MED ORDER — ACETAMINOPHEN 325 MG PO TABS: 650.0000 mg | ORAL_TABLET | ORAL | 0 refills | Status: DC | PRN

## 2018-07-29 NOTE — Lactation Note (Signed)
This note was copied from a baby's chart. Lactation Consultation Note  Patient Name: Sue Shepherd [id number] Date: 07/29/2018 Reason for consult: Follow-up assessment;Term Baby is 41 hours old/6% weight loss.  Mom reports breastfeeding is going well and she has no questions or concerns.  Discussed milk coming to volume and the prevention and treatment of engorgement.  Mom has a breast pump at home.  Lactation outpatient services and support information reviewed and encouraged prn.  Maternal Data    Feeding    LATCH Score                   Interventions    Lactation Tools Discussed/Used     Consult Status Consult Status: Complete Follow-up type: Call as needed    Huston Foley 07/29/2018, 9:11 AM

## 2018-07-29 NOTE — Discharge Summary (Addendum)
Obstetrics Discharge Summary OB/GYN Faculty Practice   Patient Name: Sue Shepherd DOB: 1990/12/22 MRN: [id number]  Date of admission: 07/27/2018 Delivering MD: Burman Nieves A   Date of discharge: 07/29/2018  Admitting diagnosis: CTX 2 mins Intrauterine pregnancy: [redacted]w[redacted]d     Secondary diagnosis:   Active Problems:   Indication for care in labor or delivery   Additional problems:  . H/o anxiety and depression not currently on medications     Discharge diagnosis: Term Pregnancy Delivered                                            Postpartum procedures: None  Complications: None  Outpatient Follow-Up: [ ]  4-6 week postpartum visit  Hospital course: Sue Shepherd is a 28 y.o. [redacted]w[redacted]d who was admitted for SOL. Her pregnancy was complicated by history of anxiety and depression. Her labor course was notable for AROM. Delivery was uncomplicated. Please see delivery/op note for additional details. Her postpartum course was uncomplicated. She was breastfeeding without difficulty. By day of discharge, she was passing flatus, urinating, eating and drinking without difficulty. Her pain was well-controlled, and she was discharged home with Ibuprofen. She will follow-up in clinic in 4-6 weeks.   Physical exam  Vitals:   07/28/18 0550 07/28/18 1000 07/28/18 2221 07/29/18 0519  BP: 110/84 112/78 91/74 107/79  Pulse: 72 86 94 70  Resp: 12 18 18 16   Temp: 98.2 F (36.8 C) 98 F (36.7 C) 98 F (36.7 C) 98 F (36.7 C)  TempSrc: Oral   Oral  SpO2: 98%  98%   Weight:      Height:       General: Well appearing, sitting comfortably in bed with baby Lochia: appropriate Uterine Fundus: firm Incision: N/A DVT Evaluation: No evidence of DVT seen on physical exam. No cords or calf tenderness. No significant calf/ankle edema. Labs: Lab Results  Component Value Date   WBC 15.0 (H) 07/27/2018   HGB 11.6 (L) 07/27/2018   HCT 36.1 07/27/2018   MCV 89.8 07/27/2018   PLT 405 (H) 07/27/2018    CMP Latest Ref Rng & Units 07/01/2017  Glucose 65 - 99 mg/dL 295(A)  BUN 6 - 20 mg/dL 10  Creatinine 2.13 - 0.86 mg/dL 5.78  Sodium 469 - 629 mmol/L 134(L)  Potassium 3.5 - 5.1 mmol/L 3.4(L)  Chloride 101 - 111 mmol/L 104  CO2 22 - 32 mmol/L 24  Calcium 8.9 - 10.3 mg/dL 5.2(W)  Total Protein 6.5 - 8.1 g/dL 7.3  Total Bilirubin 0.3 - 1.2 mg/dL 4.1(L)  Alkaline Phos 38 - 126 U/L 80  AST 15 - 41 U/L 24  ALT 14 - 54 U/L 17    Discharge instructions: Per After Visit Summary and "Baby and Me Booklet"  After visit meds:  Allergies as of 07/29/2018   No Known Allergies     Medication List    STOP taking these medications   amoxicillin-clavulanate 875-125 MG tablet Commonly known as:  Augmentin   oseltamivir 75 MG capsule Commonly known as:  Tamiflu     TAKE these medications   acetaminophen 325 MG tablet Commonly known as:  Tylenol Take 2 tablets (650 mg total) by mouth every 4 (four) hours as needed for moderate pain (for pain scale < 4). What changed:    medication strength  how much to take  when to take this  reasons  to take this   benzocaine-Menthol 20-0.5 % Aero Commonly known as:  DERMOPLAST Apply 1 application topically as needed for irritation (perineal discomfort).   docusate sodium 100 MG capsule Commonly known as:  COLACE Take 1 capsule (100 mg total) by mouth 2 (two) times daily.   ibuprofen 800 MG tablet Commonly known as:  ADVIL,MOTRIN Take 1 tablet (800 mg total) by mouth every 8 (eight) hours as needed.   PRENATAL GUMMIES/DHA & FA PO Take by mouth daily.   witch hazel-glycerin pad Commonly known as:  TUCKS Apply 1 application topically as needed for hemorrhoids.       Postpartum contraception: IUD - undecided Diet: Routine Diet Activity: Advance as tolerated. Pelvic rest for 6 weeks.   Follow-up Appt: Future Appointments  Date Time Provider Department Center  08/20/2018 10:30 AM Federico Flake, MD [facility]    Follow-up Visit:No follow-ups on file.  Newborn Data: Live born female  Birth Weight: 6 lb 14.4 oz (3130 g) APGAR: 9, 9  Newborn Delivery   Birth date/time:  07/27/2018 15:19:00 Delivery type:  Vaginal, Spontaneous     Baby Feeding: Breast Disposition:home with mother  Orpah Cobb, DO [facility], PGY1  I confirm that I have verified the information documented in the resident's note and that I have also personally reperformed the history, physical exam and all medical decision making activities of this service and have verified that all service and findings are accurately documented in this student's note.    Calvert Cantor, [place] 07/29/2018 8:00 AM

## 2018-07-31 ENCOUNTER — Ambulatory Visit (HOSPITAL_COMMUNITY): Payer: Medicaid Other

## 2018-07-31 ENCOUNTER — Ambulatory Visit (HOSPITAL_COMMUNITY): Payer: Self-pay

## 2018-08-06 ENCOUNTER — Inpatient Hospital Stay (HOSPITAL_COMMUNITY): Payer: Medicaid Other

## 2018-08-20 ENCOUNTER — Ambulatory Visit: Payer: Self-pay | Admitting: Family Medicine

## 2018-09-10 ENCOUNTER — Encounter: Payer: Self-pay | Admitting: Advanced Practice Midwife

## 2018-09-10 ENCOUNTER — Ambulatory Visit (INDEPENDENT_AMBULATORY_CARE_PROVIDER_SITE_OTHER): Payer: Medicaid Other | Admitting: Advanced Practice Midwife

## 2018-09-10 ENCOUNTER — Other Ambulatory Visit: Payer: Self-pay

## 2018-09-10 DIAGNOSIS — Z1389 Encounter for screening for other disorder: Secondary | ICD-10-CM

## 2018-09-10 DIAGNOSIS — Z30014 Encounter for initial prescription of intrauterine contraceptive device: Secondary | ICD-10-CM

## 2018-09-10 MED ORDER — NORETHINDRONE 0.35 MG PO TABS: 1.0000 | ORAL_TABLET | Freq: Every day | ORAL | 11 refills | Status: DC

## 2018-09-10 NOTE — Progress Notes (Addendum)
Post Partum Exam  Sue Shepherd is a 28 y.o. G75P2012 female who presents for a postpartum visit. She is six weeks postpartum following a 07/27/2018 vaginal delivery. I have fully reviewed the prenatal and intrapartum course. The delivery was at 39.4 gestational weeks.  Anesthesia:Epidual. Postpartum course has . Baby's course has been uncomplicated Baby is feeding by breast. Bleeding . Bowel function is normal. Bladder function is normal. Patient is sexually active. Contraception method is nothing at this time. Postpartum depression screening:negative    Last pap smear done 12/31/2017 was normal   Review of Systems A comprehensive review of systems was negative.    Objective:  Blood pressure 101/72, pulse 72, weight 100 lb (45.4 kg), currently exclusively breastfeeding every 2-3 hours.  General:  alert, cooperative, appears stated age and no distress   Breasts:  inspection negative, no nipple discharge or bleeding, no masses or nodularity palpable  Heart:  regular rate and rhythm, S1, S2 normal, no murmur, click, rub or gallop  Abdomen: soft, non-tender; bowel sounds normal; no masses,  no organomegaly   Vulva:  not evaluated and deferred for IUD placement  Vagina: not evaluated and deferred for IUD placement  Rectal Exam: Not performed.        Assessment:   Normal postpartum exam. S/p normal pap smear 12/2017. Pap not done today.  IUD not placed today given multiple episodes of unprotected sex within past two weeks. Reviewed IUD options, risks and benefits.   Reviewed time-sensitive nature of POPs, need for backup contraception if deviation from consistent administration time.  Discussed slow reintroduction of physical activity. Advised to consider water-based lubricant for intercourse as breastfeeding may impact self-lubrication.  Plan:   1. Contraception: oral progesterone-only contraceptive 2. Follow up for Liletta placement after two week interval from most recent episode of  unprotected sex or at annual well woman appt in August 2020  More than 50% of this 20 minute visit spent in counseling and coordination of care  Clayton Bibles, CNM 09/10/18 11:07 AM

## 2018-09-10 NOTE — Patient Instructions (Addendum)
Intrauterine Device Insertion, Care After    This sheet gives you information about how to care for yourself after your procedure. Your health care provider may also give you more specific instructions. If you have problems or questions, contact your health care provider.  What can I expect after the procedure?  After the procedure, it is common to have:  · Cramps and pain in the abdomen.  · Light bleeding (spotting) or heavier bleeding that is like your menstrual period. This may last for up to a few days.  · Lower back pain.  · Dizziness.  · Headaches.  · Nausea.  Follow these instructions at home:  · Before resuming sexual activity, check to make sure that you can feel the IUD string(s). You should be able to feel the end of the string(s) below the opening of your cervix. If your IUD string is in place, you may resume sexual activity.  ? If you had a hormonal IUD inserted more than 7 days after your most recent period started, you will need to use a backup method of birth control for 7 days after IUD insertion. Ask your health care provider whether this applies to you.  · Continue to check that the IUD is still in place by feeling for the string(s) after every menstrual period, or once a month.  · Take over-the-counter and prescription medicines only as told by your health care provider.  · Do not drive or use heavy machinery while taking prescription pain medicine.  · Keep all follow-up visits as told by your health care provider. This is important.  Contact a health care provider if:  · You have bleeding that is heavier or lasts longer than a normal menstrual cycle.  · You have a fever.  · You have cramps or abdominal pain that get worse or do not get better with medicine.  · You develop abdominal pain that is new or is not in the same area of earlier cramping and pain.  · You feel lightheaded or weak.  · You have abnormal or bad-smelling discharge from your vagina.  · You have pain during sexual  activity.  · You have any of the following problems with your IUD string(s):  ? The string bothers or hurts you or your sexual partner.  ? You cannot feel the string.  ? The string has gotten longer.  · You can feel the IUD in your vagina.  · You think you may be pregnant, or you miss your menstrual period.  · You think you may have an STI (sexually transmitted infection).  Get help right away if:  · You have flu-like symptoms.  · You have a fever and chills.  · You can feel that your IUD has slipped out of place.  Summary  · After the procedure, it is common to have cramps and pain in the abdomen. It is also common to have light bleeding (spotting) or heavier bleeding that is like your menstrual period.  · Continue to check that the IUD is still in place by feeling for the string(s) after every menstrual period, or once a month.  · Keep all follow-up visits as told by your health care provider. This is important.  · Contact your health care provider if you have problems with your IUD string(s), such as the string getting longer or bothering you or your sexual partner.  This information is not intended to replace advice given to you by your health care provider. Make   sure you discuss any questions you have with your health care provider.  Document Released: 12/20/2010 Document Revised: 03/14/2016 Document Reviewed: 03/14/2016  Elsevier Interactive Patient Education © 2019 [employer]

## 2018-09-23 ENCOUNTER — Ambulatory Visit (INDEPENDENT_AMBULATORY_CARE_PROVIDER_SITE_OTHER): Payer: Medicaid Other | Admitting: Obstetrics and Gynecology

## 2018-09-23 ENCOUNTER — Other Ambulatory Visit: Payer: Self-pay

## 2018-09-23 ENCOUNTER — Encounter: Payer: Self-pay | Admitting: Obstetrics and Gynecology

## 2018-09-23 DIAGNOSIS — Z3202 Encounter for pregnancy test, result negative: Secondary | ICD-10-CM

## 2018-09-23 DIAGNOSIS — Z30430 Encounter for insertion of intrauterine contraceptive device: Secondary | ICD-10-CM | POA: Diagnosis not present

## 2018-09-23 DIAGNOSIS — N882 Stricture and stenosis of cervix uteri: Secondary | ICD-10-CM

## 2018-09-23 DIAGNOSIS — Z538 Procedure and treatment not carried out for other reasons: Secondary | ICD-10-CM

## 2018-09-23 LAB — POCT URINE PREGNANCY: Preg Test, Ur: NEGATIVE

## 2018-09-23 MED ORDER — LO LOESTRIN FE 1 MG-10 MCG / 10 MCG PO TABS: 1.0000 | ORAL_TABLET | Freq: Every day | ORAL | 3 refills | Status: DC

## 2018-09-23 MED ORDER — LEVONORGESTREL 19.5 MCG/DAY IU IUD: INTRAUTERINE_SYSTEM | Freq: Once | INTRAUTERINE | Status: DC

## 2018-09-23 NOTE — Procedures (Signed)
Intrauterine Device (IUD) Insertion Attempt Procedure Note  The patient understands the risks of IUD placement, which include but are not limited to: bleeding, infection, uterine perforation, risk of expulsion, risk of failure < 1%, increased risk of ectopic pregnancy in the event of failure.   Prior to the procedure being performed, the patient (or guardian) was asked to state their full name, date of birth, and the type of procedure being performed. A bimanual exam showed the uterus to be midposition.  Next, the cervix and vagina were cleaned with an antiseptic solution, and the cervix was grasped with a tenaculum.  The uterine sound would not pass 3-4cm. Os finders were used and unable to dilate past internal os. IUD insertion attempt stopped. Tenaculum removed and area hemostatic  No complications, patient tolerated the procedure well.  Options d/w pt and she would like to do combined OCPs.   Cornelia Copa MD Attending Center for [employer] Midwife)

## 2019-01-20 ENCOUNTER — Other Ambulatory Visit: Payer: Self-pay

## 2019-01-20 ENCOUNTER — Encounter: Payer: Self-pay | Admitting: Obstetrics and Gynecology

## 2019-01-20 ENCOUNTER — Ambulatory Visit (INDEPENDENT_AMBULATORY_CARE_PROVIDER_SITE_OTHER): Payer: Medicaid Other | Admitting: Obstetrics and Gynecology

## 2019-01-20 VITALS — BP 105/67 | HR 72 | Wt 101.8 lb

## 2019-01-20 DIAGNOSIS — Z3202 Encounter for pregnancy test, result negative: Secondary | ICD-10-CM

## 2019-01-20 DIAGNOSIS — Z3042 Encounter for surveillance of injectable contraceptive: Secondary | ICD-10-CM | POA: Diagnosis not present

## 2019-01-20 DIAGNOSIS — N939 Abnormal uterine and vaginal bleeding, unspecified: Secondary | ICD-10-CM | POA: Diagnosis not present

## 2019-01-20 DIAGNOSIS — Z3041 Encounter for surveillance of contraceptive pills: Secondary | ICD-10-CM | POA: Diagnosis not present

## 2019-01-20 DIAGNOSIS — Z30011 Encounter for initial prescription of contraceptive pills: Secondary | ICD-10-CM

## 2019-01-20 LAB — POCT URINE PREGNANCY: Preg Test, Ur: NEGATIVE

## 2019-01-20 MED ORDER — MEDROXYPROGESTERONE ACETATE 150 MG/ML IM SUSP
150.0000 mg | Freq: Once | INTRAMUSCULAR | Status: AC
  Administered 2019-01-20: 150 mg via INTRAMUSCULAR

## 2019-01-20 MED ORDER — MEDROXYPROGESTERONE ACETATE 150 MG/ML IM SUSP: 150.0000 mg | Freq: Once | INTRAMUSCULAR | Status: DC

## 2019-01-20 MED ORDER — MEDROXYPROGESTERONE ACETATE 150 MG/ML IM SUSP: 150.0000 mg | INTRAMUSCULAR | 0 refills | Status: DC

## 2019-01-20 NOTE — Progress Notes (Signed)
LMP: Unknown upt-negative  Depo-Provera today

## 2019-01-20 NOTE — Progress Notes (Signed)
Obstetrics and Gynecology Visit Return Patient Evaluation  Appointment Date: 01/20/2019  Primary Care Provider: Patient, No Pcp Per  OBGYN Clinic: [facility]  Chief Complaint: pill surveillance, AUB  History of Present Illness:  Sue Shepherd is a 28 y.o. [id number] (LMP: unknown) with above CC.   Patient last seen by me in [facility] May for IUD insertion attempt approximately 8 weeks postpartum but unsuccessful due to cx stenosis. Options d/w and she started lo loestrin. She hadn't had a period prior to the IUD insertion attempt and she's unsure if she's had one since that time b/c she's had AUB on the lo loestrin which she started after that visit. She doesn't have cyclic breast tenderness. She has sporadic bleeding that's dark old blood and some cramping and last episode was 4 days ago and last a few days and this one was a little more than spotting. She would like a BC option where she is amenorrehic  Last sex 3 weeks ago; +h/o depo provera and was amenorrheic on it.   Review of Systems: as noted in the History of Present Illness. Medications:  We administered medroxyPROGESTERone. Current Outpatient Medications  Medication Sig Dispense Refill  . LO LOESTRIN FE 1 MG-10 MCG / 10 MCG tablet Take 1 tablet by mouth daily. 3 Package 3  . Prenatal MV-Min-FA-Omega-3 (PRENATAL GUMMIES/DHA & FA PO) Take by mouth daily.    Marland Kitchen witch hazel-glycerin (TUCKS) pad Apply 1 application topically as needed for hemorrhoids. 40 each 12   Current Facility-Administered Medications  Medication Dose Route Frequency Provider Last Rate Last Dose  . medroxyPROGESTERone (DEPO-PROVERA) injection 150 mg  150 mg Intramuscular Once Aletha Halim, MD        Allergies: has No Known Allergies.  Physical Exam:  BP 105/67   Pulse 72   Wt 101 lb 12.8 oz (46.2 kg)   LMP  (LMP Unknown)   BMI 20.56 kg/m  Body mass index is 20.56 kg/m. General appearance: Well nourished, well developed  female in no acute distress.  Neuro/Psych:  Normal mood and affect.    Labs: UPT negative  Assessment: pt stable  Plan:  1. Encounter for initial prescription of contraceptive pills Recommend switching to depo provera since it worked for her in the past. D/w her that also could try regular estrogen dosed pill at 30-62mcg to see if that helps with the bleeding and spotting, too. Recommend RTC 46m for MD visit and if depo working well then NTD and continue on it and recommend extra vitamin d and calcium. If not, then recommend u/s, blood work u/s for the [employer]. Pt amenable to plan.  - POCT urine pregnancy  2. Abnormal uterine bleeding (AUB)  3. Encounter for surveillance of contraceptive pills   RTC: 75m for MD visit and possible rpt depo shot.   Durene Romans MD Attending Center for [employer] Fish farm manager)

## 2019-02-17 ENCOUNTER — Encounter: Payer: Self-pay | Admitting: Radiology

## 2019-04-16 ENCOUNTER — Ambulatory Visit: Payer: Medicaid Other

## 2019-07-07 ENCOUNTER — Other Ambulatory Visit: Payer: Self-pay

## 2019-07-07 ENCOUNTER — Other Ambulatory Visit: Payer: Self-pay | Admitting: Obstetrics and Gynecology

## 2019-07-07 MED ORDER — MEDROXYPROGESTERONE ACETATE 150 MG/ML IM SUSP: 150.0000 mg | INTRAMUSCULAR | 2 refills | Status: DC

## 2019-08-23 IMAGING — DX CHEST - 2 VIEW
2 series · 2 of 2 positions shown · non-contrast
Comparison: Thoracic spine x-rays dated September 01, 2010.

CLINICAL DATA: Cough.

EXAM:
CHEST - 2 VIEW

[chest pa]
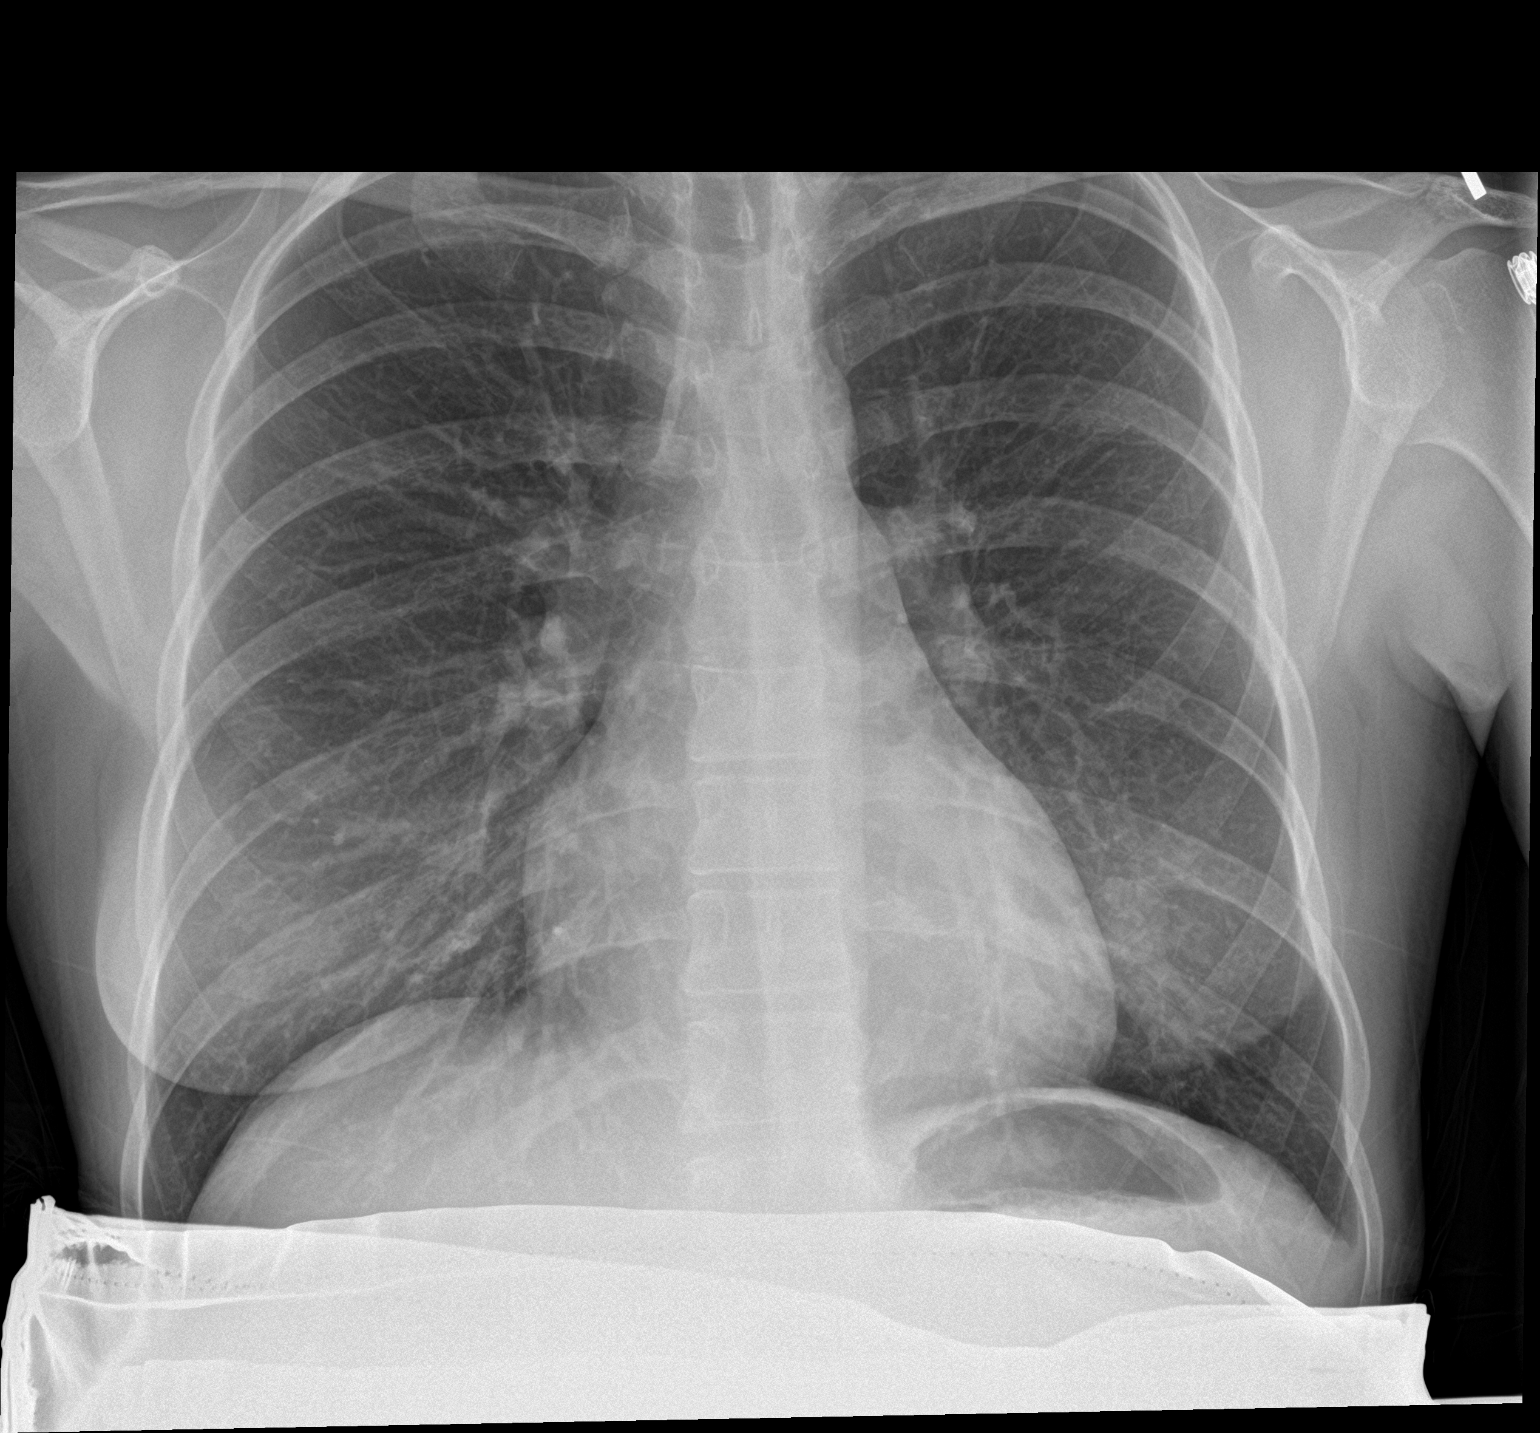

[chest lat]
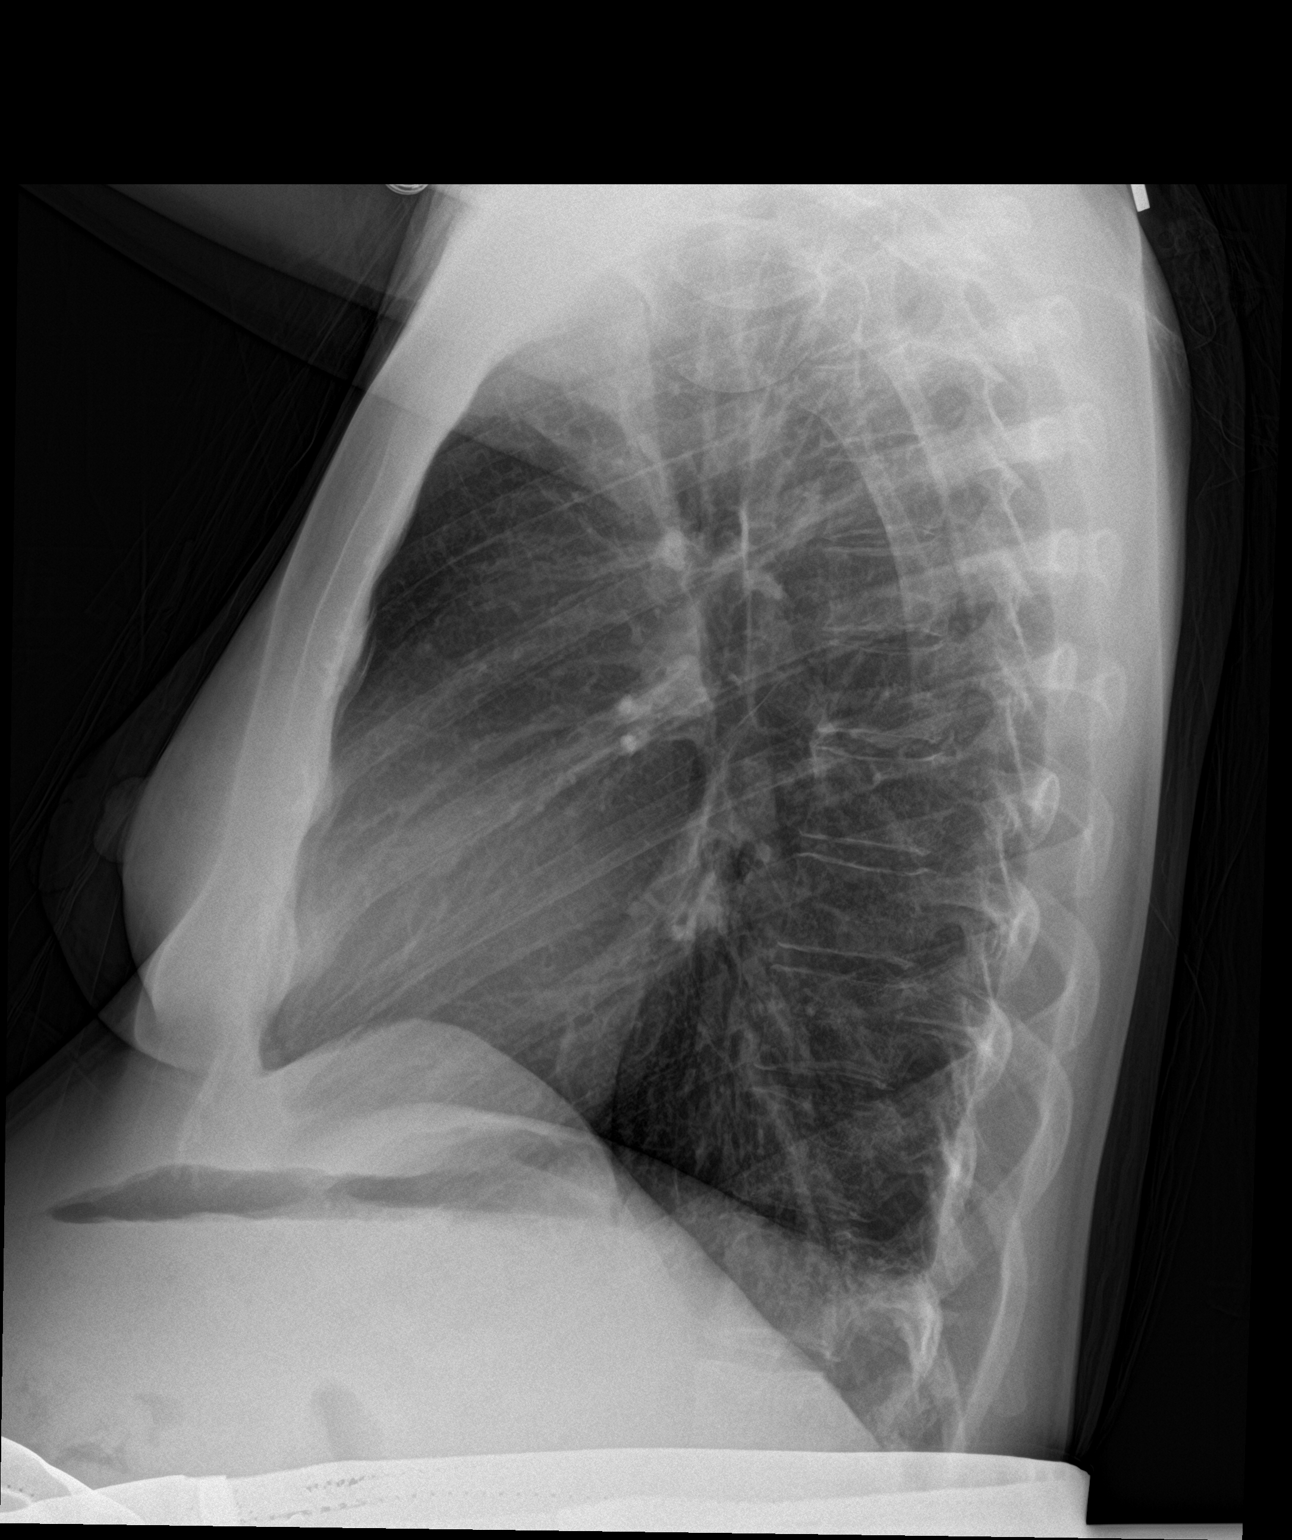

[2 of 2 positions shown; findings below may reference images not displayed]

FINDINGS: The heart size and mediastinal contours are within normal limits.
Both lungs are clear. The visualized skeletal structures are
unremarkable.
IMPRESSION: No active cardiopulmonary disease.

## 2019-12-25 DIAGNOSIS — F411 Generalized anxiety disorder: Secondary | ICD-10-CM | POA: Diagnosis not present

## 2019-12-25 DIAGNOSIS — F330 Major depressive disorder, recurrent, mild: Secondary | ICD-10-CM | POA: Diagnosis not present

## 2020-01-01 DIAGNOSIS — F411 Generalized anxiety disorder: Secondary | ICD-10-CM | POA: Diagnosis not present

## 2020-01-01 DIAGNOSIS — F330 Major depressive disorder, recurrent, mild: Secondary | ICD-10-CM | POA: Diagnosis not present

## 2020-01-08 DIAGNOSIS — F330 Major depressive disorder, recurrent, mild: Secondary | ICD-10-CM | POA: Diagnosis not present

## 2020-01-08 DIAGNOSIS — F411 Generalized anxiety disorder: Secondary | ICD-10-CM | POA: Diagnosis not present

## 2020-01-22 DIAGNOSIS — F330 Major depressive disorder, recurrent, mild: Secondary | ICD-10-CM | POA: Diagnosis not present

## 2020-01-22 DIAGNOSIS — F411 Generalized anxiety disorder: Secondary | ICD-10-CM | POA: Diagnosis not present

## 2020-01-29 DIAGNOSIS — R6882 Decreased libido: Secondary | ICD-10-CM | POA: Diagnosis not present

## 2020-01-29 DIAGNOSIS — F411 Generalized anxiety disorder: Secondary | ICD-10-CM | POA: Diagnosis not present

## 2020-01-29 DIAGNOSIS — F330 Major depressive disorder, recurrent, mild: Secondary | ICD-10-CM | POA: Diagnosis not present

## 2020-02-05 DIAGNOSIS — F411 Generalized anxiety disorder: Secondary | ICD-10-CM | POA: Diagnosis not present

## 2020-02-05 DIAGNOSIS — F330 Major depressive disorder, recurrent, mild: Secondary | ICD-10-CM | POA: Diagnosis not present

## 2020-02-26 DIAGNOSIS — F411 Generalized anxiety disorder: Secondary | ICD-10-CM | POA: Diagnosis not present

## 2020-02-26 DIAGNOSIS — F330 Major depressive disorder, recurrent, mild: Secondary | ICD-10-CM | POA: Diagnosis not present

## 2020-02-26 DIAGNOSIS — R6882 Decreased libido: Secondary | ICD-10-CM | POA: Diagnosis not present

## 2020-03-01 DIAGNOSIS — F330 Major depressive disorder, recurrent, mild: Secondary | ICD-10-CM | POA: Diagnosis not present

## 2020-03-01 DIAGNOSIS — F411 Generalized anxiety disorder: Secondary | ICD-10-CM | POA: Diagnosis not present

## 2020-03-04 ENCOUNTER — Encounter: Payer: Self-pay | Admitting: Radiology

## 2020-03-18 DIAGNOSIS — F330 Major depressive disorder, recurrent, mild: Secondary | ICD-10-CM | POA: Diagnosis not present

## 2020-03-18 DIAGNOSIS — F411 Generalized anxiety disorder: Secondary | ICD-10-CM | POA: Diagnosis not present

## 2020-03-18 DIAGNOSIS — R6882 Decreased libido: Secondary | ICD-10-CM | POA: Diagnosis not present

## 2020-04-05 DIAGNOSIS — F330 Major depressive disorder, recurrent, mild: Secondary | ICD-10-CM | POA: Diagnosis not present

## 2020-04-05 DIAGNOSIS — F411 Generalized anxiety disorder: Secondary | ICD-10-CM | POA: Diagnosis not present

## 2020-04-08 DIAGNOSIS — F330 Major depressive disorder, recurrent, mild: Secondary | ICD-10-CM | POA: Diagnosis not present

## 2020-04-08 DIAGNOSIS — F411 Generalized anxiety disorder: Secondary | ICD-10-CM | POA: Diagnosis not present

## 2020-04-08 DIAGNOSIS — R6882 Decreased libido: Secondary | ICD-10-CM | POA: Diagnosis not present

## 2020-04-14 ENCOUNTER — Other Ambulatory Visit (HOSPITAL_COMMUNITY)
Admission: RE | Admit: 2020-04-14 | Discharge: 2020-04-14 | Disposition: A | Discharge status: Home / Self Care | Payer: Medicaid Other | Source: Ambulatory Visit | Attending: Obstetrics and Gynecology | Admitting: Obstetrics and Gynecology

## 2020-04-14 ENCOUNTER — Encounter: Payer: Self-pay | Admitting: Obstetrics and Gynecology

## 2020-04-14 ENCOUNTER — Ambulatory Visit (INDEPENDENT_AMBULATORY_CARE_PROVIDER_SITE_OTHER): Payer: Medicaid Other | Admitting: Obstetrics and Gynecology

## 2020-04-14 ENCOUNTER — Other Ambulatory Visit: Payer: Self-pay

## 2020-04-14 VITALS — BP 109/75 | HR 96 | Ht 59.0 in | Wt 110.0 lb

## 2020-04-14 DIAGNOSIS — R6882 Decreased libido: Secondary | ICD-10-CM | POA: Diagnosis not present

## 2020-04-14 DIAGNOSIS — N941 Unspecified dyspareunia: Secondary | ICD-10-CM | POA: Diagnosis not present

## 2020-04-14 DIAGNOSIS — Z01419 Encounter for gynecological examination (general) (routine) without abnormal findings: Secondary | ICD-10-CM | POA: Diagnosis not present

## 2020-04-14 DIAGNOSIS — Z3042 Encounter for surveillance of injectable contraceptive: Secondary | ICD-10-CM | POA: Diagnosis not present

## 2020-04-14 MED ORDER — MISOPROSTOL 200 MCG PO TABS: ORAL_TABLET | ORAL | 0 refills | Status: DC

## 2020-04-14 NOTE — Progress Notes (Signed)
Has been giving herself her depo, last one was 9/9??  Decreased libido and pain during intercourse

## 2020-04-14 NOTE — Progress Notes (Signed)
Obstetrics and Gynecology Annual Evaluation  Appointment Date: 04/14/2020  OBGYN Clinic: [facility]  Primary Care Provider: Patient, No Pcp Per  Referring Provider: No ref. provider found  Chief Complaint:  Chief Complaint  Patient presents with  . Gynecologic Exam    History of Present Illness: Sue Shepherd is a 29 y.o. Caucasian [id number] (No LMP recorded. Patient has had an injection.), seen for the above chief complaint. Her past medical history is significant for cervical stenosis, anxiety/depression.   Patient states she has been giving herself her depo provera injections with last one in early September. She is amenorrheic on it but does have occasional spotting when it sounds like she's due for another shot. Depo start September of 2020  Low libido starting a few months after she had her baby last year.   Dyspareunia started a few months ago and is in the low belly area and usually each time. No new partner.   Review of Systems: Pertinent items noted in HPI and remainder of comprehensive ROS otherwise negative.    Patient Active Problem List   Diagnosis Date Noted  . Cervical stenosis (uterine cervix) 09/23/2018  . Unsuccessful IUD insertion 09/23/2018  . Hx of anxiety disorder 02/05/2018  . Hx of major depression 02/05/2018    Past Medical History:  Past Medical History:  Diagnosis Date  . Anxiety   . Depression     Past Surgical History:  Past Surgical History:  Procedure Laterality Date  . WISDOM TOOTH EXTRACTION      Past Obstetrical History:  OB History  Gravida Para Term Preterm AB Living  3 2 2   1 2   SAB IAB Ectopic Multiple Live Births  1     0 2    # Outcome Date GA Lbr Len/2nd Weight Sex Delivery Anes PTL Lv  3 Term 07/27/18 [redacted]w[redacted]d 07:32 / 00:17 6 lb 14.4 oz (3.13 kg) F Vag-Spont EPI  LIV  2 Term 2013   6 lb 11 oz (3.033 kg)  Vag-Spont   LIV  1 SAB 11/03/10 [redacted]w[redacted]d       DEC    Past Gynecological History: As  per HPI. History of Pap Smear(s): Yes.   Last pap 12/2017, which was negative  Social History:  Social History   Socioeconomic History  . Marital status: Single    Spouse name: Not on file  . Number of children: Not on file  . Years of education: Not on file  . Highest education level: Not on file  Occupational History  . Not on file  Tobacco Use  . Smoking status: Former Smoker    Types: Cigarettes    Quit date: 12/04/2017    Years since quitting: 2.3  . Smokeless tobacco: Never Used  Vaping Use  . Vaping Use: Never used  Substance and Sexual Activity  . Alcohol use: Not Currently  . Drug use: No  . Sexual activity: Yes    Birth control/protection: Injection  Other Topics Concern  . Not on file  Social History Narrative  . Not on file   Social Determinants of Health   Financial Resource Strain: Not on file  Food Insecurity: Not on file  Transportation Needs: Not on file  Physical Activity: Not on file  Stress: Not on file  Social Connections: Not on file  Intimate Partner Violence: Not on file    Family History:  Family History  Problem Relation Age of Onset  . Arthritis Paternal Grandfather   .  Anxiety disorder Paternal Grandfather   . Heart disease Paternal Grandfather   . Anxiety disorder Sister   . Depression Sister   . Crohn's disease Paternal Uncle   . Diabetes Maternal Grandfather   . Dementia Paternal Grandmother     Medications Sue Shepherd had no medications administered during this visit. Current Outpatient Medications  Medication Sig Dispense Refill  . buPROPion (WELLBUTRIN XL) 150 MG 24 hr tablet Take 150 mg by mouth daily.    . busPIRone (BUSPAR) 10 MG tablet Take 10 mg by mouth 3 (three) times daily.    . medroxyPROGESTERone (DEPO-PROVERA) 150 MG/ML injection INJECT I.M. EVERY 3 MONTHS. 1 mL 3   Allergies Patient has no known allergies.   Physical Exam:  BP 109/75   Pulse 96   Ht 4\' 11"  (1.499 m)   Wt 110 lb (49.9 kg)   BMI  22.22 kg/m  Body mass index is 22.22 kg/m. General appearance: Well nourished, well developed female in no acute distress.  Neck:  Supple, normal appearance, and no thyromegaly  Cardiovascular: normal s1 and s2.  No murmurs, rubs or gallops. Respiratory:  Clear to auscultation bilateral. Normal respiratory effort Abdomen: positive bowel sounds and no masses, hernias; diffusely non tender to palpation, non distended Breasts: pt denies  Neuro/Psych:  Normal mood and affect.  Skin:  Warm and dry.  Lymphatic:  No inguinal lymphadenopathy.   Pelvic exam: is not limited by body habitus EGBUS: within normal limits Vagina: within normal limits and with no blood or discharge in the vault Cervix: normal appearing cervix without tenderness, discharge or lesions. Cervix swabbed with betadine but unable to pass pipelle past 3.5cm (internal os) Uterus:  nonenlarged and non tender Adnexa:  normal adnexa and no mass, fullness, tenderness Rectovaginal: deferred  Laboratory: none  Radiology: none  Assessment: pt stable  Plan:  1. Dyspareunia in female Labs and u/s ordered.  - Cervicovaginal ancillary only( [place]) - HIV antibody (with reflex) - RPR - Hepatitis B Surface AntiGEN - Hepatitis C Antibody - TSH - PELVIC COMPLETE WITH TRANSVAGINAL; Future - Urine Culture  2. Well woman exam Routine care.pap due in late 2022 - Cervicovaginal ancillary only( [place]) - HIV antibody (with reflex) - RPR - Hepatitis B Surface AntiGEN - Hepatitis C Antibody - TSH  3. Low libido She started her psychiatric medications only a few months ago but she did start birth control (first was on OCPs and then depo) around that time so definitely could be a possibly. She also stated that her dyspareunia only started a few months but it could be contributing to her low libido if she associates being uncomfortable each time she has sex.  I brought up paragard but she still has stenosis. F/u u/s and  recommend pt take cytotec 400 PV the night before her next appointment and talk to her about continuing on depo provera or trying paragard insertion. Pt to be abstinent until then for UPT nv   4. Encounter for surveillance of injectable contraceptive See above. Pt lives an hour away and d/w her that we have to give her the injection; pt was confused and thought she could do it since it had a needle on it. Will schedule u/s and md visit for same day  Orders Placed This Encounter  Procedures  . Urine Culture  . 2023 PELVIC COMPLETE WITH TRANSVAGINAL  . HIV antibody (with reflex)  . RPR  . Hepatitis B Surface AntiGEN  . Hepatitis C Antibody  .  TSH    RTC u/s follow up, review labs and d/w her re: continuing on depo provera or try for paragard insertion.   Cornelia Copa MD Attending Center for [employer] [facility])

## 2020-04-15 LAB — HEPATITIS C ANTIBODY: Hep C Virus Ab: <0.1 s/co ratio (ref 0.0–0.9)

## 2020-04-15 LAB — HEPATITIS B SURFACE ANTIGEN: Hepatitis B Surface Ag: NEGATIVE

## 2020-04-15 LAB — TSH: TSH: 0.868 u[IU]/mL (ref 0.450–4.500)

## 2020-04-15 LAB — HIV ANTIBODY (ROUTINE TESTING W REFLEX): HIV Screen 4th Generation wRfx: NONREACTIVE

## 2020-04-15 LAB — RPR: RPR Ser Ql: NONREACTIVE

## 2020-04-16 LAB — URINE CULTURE

## 2020-04-18 LAB — CERVICOVAGINAL ANCILLARY ONLY
Bacterial Vaginitis (gardnerella): NEGATIVE
Chlamydia: NEGATIVE
Comment: NEGATIVE
Comment: NEGATIVE
Comment: NEGATIVE
Comment: NORMAL
Neisseria Gonorrhea: NEGATIVE
Trichomonas: NEGATIVE

## 2020-04-25 DIAGNOSIS — R6882 Decreased libido: Secondary | ICD-10-CM | POA: Diagnosis not present

## 2020-04-25 DIAGNOSIS — F411 Generalized anxiety disorder: Secondary | ICD-10-CM | POA: Diagnosis not present

## 2020-04-25 DIAGNOSIS — F330 Major depressive disorder, recurrent, mild: Secondary | ICD-10-CM | POA: Diagnosis not present

## 2020-05-03 DIAGNOSIS — F411 Generalized anxiety disorder: Secondary | ICD-10-CM | POA: Diagnosis not present

## 2020-05-03 DIAGNOSIS — F330 Major depressive disorder, recurrent, mild: Secondary | ICD-10-CM | POA: Diagnosis not present

## 2020-05-07 LAB — SPECIMEN STATUS REPORT

## 2020-05-07 LAB — BETA HCG QUANT (REF LAB): hCG Quant: <1 m[IU]/mL

## 2020-05-09 DIAGNOSIS — F411 Generalized anxiety disorder: Secondary | ICD-10-CM | POA: Diagnosis not present

## 2020-05-09 DIAGNOSIS — F330 Major depressive disorder, recurrent, mild: Secondary | ICD-10-CM | POA: Diagnosis not present

## 2020-05-09 DIAGNOSIS — R6882 Decreased libido: Secondary | ICD-10-CM | POA: Diagnosis not present

## 2020-05-12 ENCOUNTER — Encounter: Payer: Self-pay | Admitting: Obstetrics & Gynecology

## 2020-05-12 ENCOUNTER — Ambulatory Visit (INDEPENDENT_AMBULATORY_CARE_PROVIDER_SITE_OTHER): Payer: Medicaid Other | Admitting: Obstetrics & Gynecology

## 2020-05-12 ENCOUNTER — Other Ambulatory Visit: Payer: Self-pay

## 2020-05-12 ENCOUNTER — Other Ambulatory Visit (HOSPITAL_COMMUNITY)
Admission: RE | Admit: 2020-05-12 | Discharge: 2020-05-12 | Disposition: A | Discharge status: Home / Self Care | Payer: Medicaid Other | Source: Ambulatory Visit | Attending: Obstetrics & Gynecology | Admitting: Obstetrics & Gynecology

## 2020-05-12 ENCOUNTER — Ambulatory Visit
Admission: RE | Admit: 2020-05-12 | Discharge: 2020-05-12 | Disposition: A | Discharge status: Home / Self Care | Payer: Medicaid Other | Source: Ambulatory Visit | Attending: Obstetrics and Gynecology | Admitting: Obstetrics and Gynecology

## 2020-05-12 ENCOUNTER — Telehealth: Payer: Self-pay

## 2020-05-12 VITALS — BP 104/70 | HR 88 | Ht 59.0 in | Wt 108.0 lb

## 2020-05-12 DIAGNOSIS — Z124 Encounter for screening for malignant neoplasm of cervix: Secondary | ICD-10-CM | POA: Insufficient documentation

## 2020-05-12 DIAGNOSIS — N941 Unspecified dyspareunia: Secondary | ICD-10-CM | POA: Insufficient documentation

## 2020-05-12 DIAGNOSIS — Z975 Presence of (intrauterine) contraceptive device: Secondary | ICD-10-CM | POA: Insufficient documentation

## 2020-05-12 DIAGNOSIS — N854 Malposition of uterus: Secondary | ICD-10-CM | POA: Diagnosis not present

## 2020-05-12 DIAGNOSIS — Z30430 Encounter for insertion of intrauterine contraceptive device: Secondary | ICD-10-CM

## 2020-05-12 DIAGNOSIS — Z3202 Encounter for pregnancy test, result negative: Secondary | ICD-10-CM | POA: Diagnosis not present

## 2020-05-12 DIAGNOSIS — N882 Stricture and stenosis of cervix uteri: Secondary | ICD-10-CM

## 2020-05-12 LAB — POCT URINE PREGNANCY: Preg Test, Ur: NEGATIVE

## 2020-05-12 MED ORDER — PARAGARD INTRAUTERINE COPPER IU IUD: INTRAUTERINE_SYSTEM | Freq: Once | INTRAUTERINE | Status: AC

## 2020-05-12 NOTE — Progress Notes (Signed)
    GYNECOLOGY OFFICE PROCEDURE NOTE  Sue Shepherd is a 30 y.o. [id number] here for Paragard IUD insertion. Was attempted on 04/14/2020 but this was unsuccessful due to cervical stenosis. Had pelvic ultrasound, no significant findings. Was premedicated with Cytotec, she placed this vaginally overnight.  No GYN concerns.  Last pap smear was on 12/31/2017 and was normal and she desires another pap smear today.  IUD Insertion Procedure Note Patient identified, informed consent performed, consent signed.   Discussed risks of irregular bleeding, cramping, infection, malpositioning or misplacement of the IUD outside the uterus which may require further procedure such as laparoscopy. Also discussed >99% contraception efficacy, increased risk of ectopic pregnancy with failure of method.   Emphasized that this did not protect against STIs, condoms recommended during all sexual encounters. Time out was performed.  Urine pregnancy test negative.  Speculum placed in the vagina.  Cervix visualized.  Pap smear done. Cervix was cleaned with Betadine x 2; Cytotec tablet remnants removed.  Grasped anteriorly with a single tooth tenaculum.  Uterus sounded to 6.5 cm, consistent with ultrasound measurements.  Paragard IUD placed per manufacturer's recommendations without any difficulty.  Strings trimmed to 3 cm. Tenaculum was removed, good hemostasis noted.  Patient tolerated procedure well.   Patient was given post-procedure instructions.  Patient was also asked to check IUD strings periodically and follow up in 4 weeks for IUD check.   Jaynie Collins, MD, FACOG Obstetrician & Gynecologist, [facility] for [employer], [facility]

## 2020-05-12 NOTE — Telephone Encounter (Signed)
Pt called stating she was concerned her medication for "cervical ripening" is not working. Clarified the pt took Cytotec as directed and informed her the medication is working. Pt concerned she is not experiencing cramping so it is not working, advised that is normal and she has followed appropriate directions. Pt verbalizes understanding.

## 2020-05-12 NOTE — Patient Instructions (Signed)
Intrauterine Device Insertion, Care After  This sheet gives you information about how to care for yourself after your procedure. Your health care provider may also give you more specific instructions. If you have problems or questions, contact your health care provider. What can I expect after the procedure? After the procedure, it is common to have:  Cramps and pain in the abdomen.  Light bleeding (spotting) or heavier bleeding that is like your menstrual period. This may last for up to a few days.  Lower back pain.  Dizziness.  Headaches.  Nausea. Follow these instructions at home:  Before resuming sexual activity, check to make sure that you can feel the IUD string(s). You should be able to feel the end of the string(s) below the opening of your cervix. If your IUD string is in place, you may resume sexual activity. ? If you had a hormonal IUD inserted more than 7 days after your most recent period started, you will need to use a backup method of birth control for 7 days after IUD insertion. Ask your health care provider whether this applies to you.  Continue to check that the IUD is still in place by feeling for the string(s) after every menstrual period, or once a month.  Take over-the-counter and prescription medicines only as told by your health care provider.  Do not drive or use heavy machinery while taking prescription pain medicine.  Keep all follow-up visits as told by your health care provider. This is important. Contact a health care provider if:  You have bleeding that is heavier or lasts longer than a normal menstrual cycle.  You have a fever.  You have cramps or abdominal pain that get worse or do not get better with medicine.  You develop abdominal pain that is new or is not in the same area of earlier cramping and pain.  You feel lightheaded or weak.  You have abnormal or bad-smelling discharge from your vagina.  You have pain during sexual  activity.  You have any of the following problems with your IUD string(s): ? The string bothers or hurts you or your sexual partner. ? You cannot feel the string. ? The string has gotten longer.  You can feel the IUD in your vagina.  You think you may be pregnant, or you miss your menstrual period.  You think you may have an STI (sexually transmitted infection). Get help right away if:  You have flu-like symptoms.  You have a fever and chills.  You can feel that your IUD has slipped out of place. Summary  After the procedure, it is common to have cramps and pain in the abdomen. It is also common to have light bleeding (spotting) or heavier bleeding that is like your menstrual period.  Continue to check that the IUD is still in place by feeling for the string(s) after every menstrual period, or once a month.  Keep all follow-up visits as told by your health care provider. This is important.  Contact your health care provider if you have problems with your IUD string(s), such as the string getting longer or bothering you or your sexual partner. This information is not intended to replace advice given to you by your health care provider. Make sure you discuss any questions you have with your health care provider. Document Revised: 04/05/2017 Document Reviewed: 03/14/2016 Elsevier Patient Education  2020 Elsevier Inc.  

## 2020-05-17 LAB — CYTOLOGY - PAP
Chlamydia: NEGATIVE
Comment: NEGATIVE
Comment: NEGATIVE
Comment: NORMAL
Diagnosis: NEGATIVE
Neisseria Gonorrhea: NEGATIVE
Trichomonas: NEGATIVE

## 2020-05-26 ENCOUNTER — Telehealth: Payer: Self-pay | Admitting: *Deleted

## 2020-05-26 MED ORDER — TRANEXAMIC ACID 650 MG PO TABS: 1300.0000 mg | ORAL_TABLET | Freq: Three times a day (TID) | ORAL | 0 refills | Status: DC

## 2020-05-26 NOTE — Telephone Encounter (Signed)
Pt called stating she had the paragard IUD placed 2 weeks ago and has been bleeding everyday since then and didn't know if there was anything we can do to help. Pt is unable to take ibuprofen due to history of stomach ulcers and is also trying  to stay off anything hormonal due to depression medications. Will discuss with Dr Shawnie Pons and see what we can do. Will send in RX for tranexamic acid to try.

## 2020-05-30 DIAGNOSIS — F330 Major depressive disorder, recurrent, mild: Secondary | ICD-10-CM | POA: Diagnosis not present

## 2020-05-30 DIAGNOSIS — F439 Reaction to severe stress, unspecified: Secondary | ICD-10-CM | POA: Diagnosis not present

## 2020-05-30 DIAGNOSIS — F411 Generalized anxiety disorder: Secondary | ICD-10-CM | POA: Diagnosis not present

## 2020-06-09 ENCOUNTER — Other Ambulatory Visit: Payer: Self-pay

## 2020-06-09 ENCOUNTER — Encounter: Payer: Self-pay | Admitting: Obstetrics and Gynecology

## 2020-06-09 ENCOUNTER — Ambulatory Visit (INDEPENDENT_AMBULATORY_CARE_PROVIDER_SITE_OTHER): Payer: Medicaid Other | Admitting: Obstetrics and Gynecology

## 2020-06-09 VITALS — BP 105/69 | HR 92 | Wt 108.0 lb

## 2020-06-09 DIAGNOSIS — Z975 Presence of (intrauterine) contraceptive device: Secondary | ICD-10-CM | POA: Diagnosis not present

## 2020-06-09 DIAGNOSIS — N939 Abnormal uterine and vaginal bleeding, unspecified: Secondary | ICD-10-CM

## 2020-06-11 DIAGNOSIS — N939 Abnormal uterine and vaginal bleeding, unspecified: Secondary | ICD-10-CM | POA: Insufficient documentation

## 2020-06-11 NOTE — Progress Notes (Signed)
Obstetrics and Gynecology Visit Return Patient Evaluation  Appointment Date: 06/11/2020  Primary Care Provider: Patient, No Pcp Per  OBGYN Clinic: [facility]  Chief Complaint: IUD string check  History of Present Illness:  Sue Shepherd is a 30 y.o. s/p paragard insertion on 1/6 by Dr. Macon Large.  She states she'd like the strings trimmed b/c her partner can feel them. She checks the strings herself and states that the strings feel uneven. She also has some spotting that is getting better, no pain with the IUD. She called on 1/20 stating she had bleeding since IUD was placed and Lysteda was sent in  Review of Systems: as noted in the History of Present Illness.  Patient Active Problem List   Diagnosis Date Noted  . Abnormal uterine bleeding (AUB) 06/11/2020  . Paragard IUD (intrauterine device) placed 05/12/2020 05/12/2020  . Cervical stenosis (uterine cervix) 09/23/2018  . Hx of anxiety disorder 02/05/2018  . Hx of major depression 02/05/2018   Medications:  Stanton Kidney had no medications administered during this visit. Current Outpatient Medications  Medication Sig Dispense Refill  . buPROPion (WELLBUTRIN XL) 150 MG 24 hr tablet Take 150 mg by mouth daily.    . busPIRone (BUSPAR) 10 MG tablet Take 10 mg by mouth 3 (three) times daily.      Allergies: has No Known Allergies.  Physical Exam:  BP 105/69   Pulse 92   Wt 108 lb (49 kg)   BMI 21.81 kg/m  Body mass index is 21.81 kg/m. General appearance: Well nourished, well developed female in no acute distress.  Neuro/Psych:  Normal mood and affect.    Pelvic exam:  EGBUS vaginal vault: scant brown d/c in vault cervix: within normal limits, one white IUD string seen that is about 1cm. I don't see the 2nd IUD string   Assessment: pt stable  Plan:  1. Abnormal uterine bleeding (AUB) U/s recommended but patient would like to keep an eye on it and see if things keep getting better  I  told her I don't recommend cutting the string, so I tucked it into the external os after d/w her re: potential for difficult removal in the future  2. Paragard IUD (intrauterine device) placed 05/12/2020   RTC: PRN  Cornelia Copa MD Attending Center for [facility])

## 2020-06-14 DIAGNOSIS — F439 Reaction to severe stress, unspecified: Secondary | ICD-10-CM | POA: Diagnosis not present

## 2020-06-14 DIAGNOSIS — F330 Major depressive disorder, recurrent, mild: Secondary | ICD-10-CM | POA: Diagnosis not present

## 2020-06-14 DIAGNOSIS — F411 Generalized anxiety disorder: Secondary | ICD-10-CM | POA: Diagnosis not present

## 2020-06-20 DIAGNOSIS — F411 Generalized anxiety disorder: Secondary | ICD-10-CM | POA: Diagnosis not present

## 2020-06-20 DIAGNOSIS — F330 Major depressive disorder, recurrent, mild: Secondary | ICD-10-CM | POA: Diagnosis not present

## 2020-07-04 ENCOUNTER — Other Ambulatory Visit: Payer: Self-pay | Admitting: Obstetrics and Gynecology

## 2020-07-04 DIAGNOSIS — N939 Abnormal uterine and vaginal bleeding, unspecified: Secondary | ICD-10-CM

## 2020-07-04 DIAGNOSIS — F330 Major depressive disorder, recurrent, mild: Secondary | ICD-10-CM | POA: Diagnosis not present

## 2020-07-04 DIAGNOSIS — F411 Generalized anxiety disorder: Secondary | ICD-10-CM | POA: Diagnosis not present

## 2020-07-04 DIAGNOSIS — Z975 Presence of (intrauterine) contraceptive device: Secondary | ICD-10-CM

## 2020-07-05 DIAGNOSIS — F411 Generalized anxiety disorder: Secondary | ICD-10-CM | POA: Diagnosis not present

## 2020-07-05 DIAGNOSIS — F439 Reaction to severe stress, unspecified: Secondary | ICD-10-CM | POA: Diagnosis not present

## 2020-07-05 DIAGNOSIS — F330 Major depressive disorder, recurrent, mild: Secondary | ICD-10-CM | POA: Diagnosis not present

## 2020-07-11 ENCOUNTER — Ambulatory Visit
Admission: RE | Admit: 2020-07-11 | Discharge: 2020-07-11 | Disposition: A | Discharge status: Home / Self Care | Payer: Medicaid Other | Source: Ambulatory Visit | Attending: Obstetrics and Gynecology | Admitting: Obstetrics and Gynecology

## 2020-07-11 ENCOUNTER — Other Ambulatory Visit: Payer: Self-pay

## 2020-07-11 DIAGNOSIS — Z975 Presence of (intrauterine) contraceptive device: Secondary | ICD-10-CM | POA: Diagnosis not present

## 2020-07-11 DIAGNOSIS — N854 Malposition of uterus: Secondary | ICD-10-CM | POA: Diagnosis not present

## 2020-07-11 DIAGNOSIS — N939 Abnormal uterine and vaginal bleeding, unspecified: Secondary | ICD-10-CM | POA: Diagnosis not present

## 2020-07-13 ENCOUNTER — Telehealth: Payer: Self-pay | Admitting: Obstetrics and Gynecology

## 2020-07-13 DIAGNOSIS — T8332XD Displacement of intrauterine contraceptive device, subsequent encounter: Secondary | ICD-10-CM

## 2020-07-13 DIAGNOSIS — T8332XA Displacement of intrauterine contraceptive device, initial encounter: Secondary | ICD-10-CM | POA: Insufficient documentation

## 2020-07-13 HISTORY — DX: Displacement of intrauterine contraceptive device, initial encounter: T83.32XA

## 2020-07-13 NOTE — Telephone Encounter (Signed)
GYN Telephone Note Patient called and results d/w her and I recommend IUD removal; pt already has an appt on 3/14. She would like another one. I don't recommend that given her stenosis and difficulty in putting it initially. Will d/w her more at the removal visit but if she does want one, then it would need to be under ultrasound guidance.  Pt still having some AUB, not heavy  Cornelia Copa MD Attending Center for [employer] (Faculty Practice) 07/13/2020 Time: [phone]

## 2020-07-18 ENCOUNTER — Encounter: Payer: Self-pay | Admitting: Obstetrics and Gynecology

## 2020-07-18 ENCOUNTER — Other Ambulatory Visit: Payer: Self-pay

## 2020-07-18 ENCOUNTER — Ambulatory Visit (INDEPENDENT_AMBULATORY_CARE_PROVIDER_SITE_OTHER): Payer: Medicaid Other | Admitting: Obstetrics and Gynecology

## 2020-07-18 VITALS — BP 96/63 | HR 64 | Ht 59.0 in | Wt 109.0 lb

## 2020-07-18 DIAGNOSIS — N939 Abnormal uterine and vaginal bleeding, unspecified: Secondary | ICD-10-CM | POA: Diagnosis not present

## 2020-07-18 DIAGNOSIS — Z975 Presence of (intrauterine) contraceptive device: Secondary | ICD-10-CM | POA: Diagnosis not present

## 2020-07-18 DIAGNOSIS — Z3009 Encounter for other general counseling and advice on contraception: Secondary | ICD-10-CM

## 2020-07-18 DIAGNOSIS — Z30432 Encounter for removal of intrauterine contraceptive device: Secondary | ICD-10-CM | POA: Diagnosis not present

## 2020-07-18 DIAGNOSIS — T8332XA Displacement of intrauterine contraceptive device, initial encounter: Secondary | ICD-10-CM | POA: Diagnosis not present

## 2020-07-18 MED ORDER — LEVONORGESTREL 1.5 MG PO TABS: 1.5000 mg | ORAL_TABLET | Freq: Once | ORAL | 0 refills | Status: AC

## 2020-07-18 NOTE — Procedures (Signed)
Intrauterine Device (IUD) Removal Procedure Note  Prior to the procedure being performed, the patient (or guardian) was asked to state their full name, date of birth, and the type of procedure being performed. EGBUS normal. Vaginal vault normal. Cervix normal with IUD strings seen (approx 0.5 cm in length). Strings grasped with ringed forceps and easily removed and noted to be intact; IUD is a paragard.   No complications, patient tolerated the procedure well.  Cornelia Copa MD Attending Center for [employer] (Faculty Practice) 07/18/2020

## 2020-07-18 NOTE — Progress Notes (Signed)
Patient is still having abnormal bleeding with her IUD. Armandina Stammer RN

## 2020-07-18 NOTE — Progress Notes (Signed)
Obstetrics and Gynecology Visit Return Patient Evaluation  Appointment Date: 07/18/2020  Primary Care Provider: Patient, No Pcp Per  OBGYN Clinic: [facility]  Chief Complaint: AUB, malpositioned paragard on u/s  History of Present Illness:  Sue Shepherd is a 30 y.o. P2 with above CC. Patient with cervical stenosis. She underwent paragard IUD insertion s/p cytotec cx prep on 05/12/20. When seen for her one month string check, she was having AUB. She desired exp management but I told her if AUB worsened or persisted to call for u/s and follow up. U/s done on 3/7 and showed malpositioned IUD with right arm in uterine wall.  Interval History: Since that time, she states that she has stable, persistent AUB.   Review of Systems:  as noted in the History of Present Illness.   Patient Active Problem List   Diagnosis Date Noted  . Malpositioned intrauterine device 07/13/2020  . Abnormal uterine bleeding (AUB) 06/11/2020  . Paragard IUD (intrauterine device) placed 05/12/2020 05/12/2020  . Cervical stenosis (uterine cervix) 09/23/2018  . Hx of anxiety disorder 02/05/2018  . Hx of major depression 02/05/2018   Medications:  Stanton Kidney had no medications administered during this visit. Current Outpatient Medications  Medication Sig Dispense Refill  . buPROPion (WELLBUTRIN XL) 150 MG 24 hr tablet Take 150 mg by mouth daily.    . busPIRone (BUSPAR) 10 MG tablet Take 10 mg by mouth 3 (three) times daily.      Allergies: has No Known Allergies.  Physical Exam:  BP 96/63   Pulse 64   Ht 4\' 11"  (1.499 m)   Wt 109 lb (49.4 kg)   BMI 22.02 kg/m  Body mass index is 22.02 kg/m. General appearance: Well nourished, well developed female in no acute distress.  Abdomen: diffusely non tender to palpation, non distended, and no masses, hernias Neuro/Psych:  Normal mood and affect.    Pelvic exam:  EGBUS, vaginal vault and cervix: within normal limits with mild  amount of old blood and brown d/c in the vault. White IUD string just poking out of the external os. Paragard removed w/o difficulty and intact   Assessment: pt stable s/p paragard removal  Plan:  1. IUD migration, initial encounter Removed today  2. Abnormal uterine bleeding (AUB) Follow up s/sp removal   3. Encounter for other general counseling or advice on contraception She was on depo provera before and did well on that but wanting to do a non hormonal option. Options d/w her and she has been thinking about a BTL and is still interested in that. Extensively counseled about it and via the different methods (filshie vs salpingectomy vs kleppinger).  Pt unsure of options but signed BTL papers since she lives about 45 minutes away.  Pt to consider options and let know what she'd like to do next. In the interim, I advised PRN plan B usage  5. Encounter for IUD removal   RTC: PRN  [place] MD Attending Center for Cornelia Copa [facility])

## 2020-07-19 ENCOUNTER — Encounter: Payer: Self-pay | Admitting: Obstetrics and Gynecology

## 2020-07-19 HISTORY — PX: IUD REMOVAL: OBO 1004

## 2020-07-21 ENCOUNTER — Emergency Department (HOSPITAL_COMMUNITY)
Admission: EM | Admit: 2020-07-21 | Discharge: 2020-07-21 | Disposition: A | Discharge status: Home / Self Care | Payer: Medicaid Other | Attending: Emergency Medicine | Admitting: Emergency Medicine

## 2020-07-21 ENCOUNTER — Other Ambulatory Visit: Payer: Self-pay

## 2020-07-21 ENCOUNTER — Encounter (HOSPITAL_COMMUNITY): Payer: Self-pay | Admitting: *Deleted

## 2020-07-21 DIAGNOSIS — Z87891 Personal history of nicotine dependence: Secondary | ICD-10-CM | POA: Insufficient documentation

## 2020-07-21 DIAGNOSIS — M5442 Lumbago with sciatica, left side: Secondary | ICD-10-CM | POA: Diagnosis not present

## 2020-07-21 DIAGNOSIS — X58XXXA Exposure to other specified factors, initial encounter: Secondary | ICD-10-CM | POA: Insufficient documentation

## 2020-07-21 DIAGNOSIS — Y92410 Unspecified street and highway as the place of occurrence of the external cause: Secondary | ICD-10-CM | POA: Diagnosis not present

## 2020-07-21 DIAGNOSIS — Y9389 Activity, other specified: Secondary | ICD-10-CM | POA: Insufficient documentation

## 2020-07-21 DIAGNOSIS — M545 Low back pain, unspecified: Secondary | ICD-10-CM | POA: Diagnosis present

## 2020-07-21 LAB — PREGNANCY, URINE: Preg Test, Ur: NEGATIVE

## 2020-07-21 MED ORDER — KETOROLAC TROMETHAMINE 60 MG/2ML IM SOLN
30.0000 mg | Freq: Once | INTRAMUSCULAR | Status: AC
  Administered 2020-07-21: 30 mg via INTRAMUSCULAR
  Filled 2020-07-21: qty 2

## 2020-07-21 MED ORDER — METHOCARBAMOL 500 MG PO TABS: 500.0000 mg | ORAL_TABLET | Freq: Two times a day (BID) | ORAL | 0 refills | Status: DC | PRN

## 2020-07-21 MED ORDER — NAPROXEN 500 MG PO TABS: 500.0000 mg | ORAL_TABLET | Freq: Two times a day (BID) | ORAL | 0 refills | Status: DC

## 2020-07-21 NOTE — ED Provider Notes (Signed)
[facility] EMERGENCY DEPARTMENT Provider Note   CSN: [id number] Arrival date & time: 07/21/20  1620     History Chief Complaint  Patient presents with  . Back Pain    Sue Shepherd is a 30 y.o. female.  30 year old female presents to the emergency department for evaluation of lower back pain x3 weeks.  Pain began after a long car ride from [place].  She has had similar back pain in the past when seated for prolonged periods of time, but pain usually resolves in a few days.  This pain has persisted longer than normal.  She took Tylenol on a few occasions without symptomatic improvement.  Feels that she is experiencing spasms to her low back intermittently.  Pain in her back does radiate to her left outer thigh at times.  She has not had any associated extremity numbness, paresthesias, extremity weakness, bowel or bladder incontinence, fevers, abdominal pain, dysuria, hematuria.  No recent falls or trauma.  Denies personal history of cancer as well as IV drug use.        Past Medical History:  Diagnosis Date  . Anxiety   . Depression   . Malpositioned intrauterine device 07/13/2020    Patient Active Problem List   Diagnosis Date Noted  . Abnormal uterine bleeding (AUB) 06/11/2020  . Cervical stenosis (uterine cervix) 09/23/2018  . Hx of anxiety disorder 02/05/2018  . Hx of major depression 02/05/2018    Past Surgical History:  Procedure Laterality Date  . IUD REMOVAL  07/19/2020      . WISDOM TOOTH EXTRACTION       OB History    Gravida  3   Para  2   Term  2   Preterm      AB  1   Living  2     SAB  1   IAB      Ectopic      Multiple  0   Live Births  2           Family History  Problem Relation Age of Onset  . Arthritis Paternal Grandfather   . Anxiety disorder Paternal Grandfather   . Heart disease Paternal Grandfather   . Anxiety disorder Sister   . Depression Sister   . Crohn's disease Paternal Uncle   . Diabetes Maternal  Grandfather   . Dementia Paternal Grandmother     Social History   Tobacco Use  . Smoking status: Former Smoker    Types: Cigarettes    Quit date: 12/04/2017    Years since quitting: 2.6  . Smokeless tobacco: Never Used  Vaping Use  . Vaping Use: Never used  Substance Use Topics  . Alcohol use: Not Currently  . Drug use: No    Home Medications Prior to Admission medications   Medication Sig Start Date End Date Taking? Authorizing Provider  methocarbamol (ROBAXIN) 500 MG tablet Take 1 tablet (500 mg total) by mouth every 12 (twelve) hours as needed for muscle spasms. 07/21/20  Yes Antony Madura, PA-C  naproxen (NAPROSYN) 500 MG tablet Take 1 tablet (500 mg total) by mouth 2 (two) times daily with a meal. 07/21/20  Yes Antony Madura, PA-C  buPROPion (WELLBUTRIN XL) 150 MG 24 hr tablet Take 150 mg by mouth daily.    [provider]  busPIRone (BUSPAR) 10 MG tablet Take 10 mg by mouth 3 (three) times daily.    [provider]    Allergies    Patient has no known  allergies.  Review of Systems   Review of Systems  Ten systems reviewed and are negative for acute change, except as noted in the HPI.    Physical Exam Updated Vital Signs BP 98/65 (BP Location: Right Arm)   Pulse (!) 105   Temp 98.8 F (37.1 C) (Oral)   Resp 18   Ht 4\' 11"  (1.499 m)   Wt 49.2 kg   LMP 06/19/2020   SpO2 100%   BMI 21.91 kg/m   Physical Exam Vitals and nursing note reviewed.  Constitutional:      General: She is not in acute distress.    Appearance: She is well-developed. She is not diaphoretic.     Comments: Nontoxic appearing and in NAD  HENT:     Head: Normocephalic and atraumatic.  Eyes:     General: No scleral icterus.    Conjunctiva/sclera: Conjunctivae normal.  Cardiovascular:     Rate and Rhythm: Normal rate and regular rhythm.     Pulses: Normal pulses.  Pulmonary:     Effort: Pulmonary effort is normal. No respiratory distress.     Comments: Respirations even  and unlabored Abdominal:     General: There is no distension.     Palpations: Abdomen is soft.  Musculoskeletal:        General: Normal range of motion.     Cervical back: Normal range of motion.     Comments: Reports pain to lower lumbar midline, but no worsening of pain on palpation.  No bony deformities, step-offs, crepitus.  Positive straight leg raise on the left.  Skin:    General: Skin is warm and dry.     Coloration: Skin is not pale.     Findings: No erythema or rash.  Neurological:     Mental Status: She is alert and oriented to person, place, and time.     Coordination: Coordination normal.     Comments: Sensation to light touch intact and equal in bilateral lower extremities.  Normal strength.  Ambulatory in the department without assistance or difficulty.  Psychiatric:        Behavior: Behavior normal.     ED Results / Procedures / Treatments   Labs (all labs ordered are listed, but only abnormal results are displayed) Labs Reviewed  PREGNANCY, URINE    EKG None  Radiology No results found.  Procedures Procedures   Medications Ordered in ED Medications  ketorolac (TORADOL) injection 30 mg (30 mg Intramuscular Given 07/21/20 1937)    ED Course  I have reviewed the triage vital signs and the nursing notes.  Pertinent labs & imaging results that were available during my care of the patient were reviewed by me and considered in my medical decision making (see chart for details).    MDM Rules/Calculators/A&P                          Patient with back pain x 3 weeks, onset after a long car ride.  Hx of similar pain, but usually resolves after a few days.  She is neurovascularly intact on exam, ambulatory.  No history of trauma.  No loss of bowel or bladder control.  No concern for cauda equina.  No fever, h/o cancer, IVDU.  Suspect musculoskeletal etiology, sciatica.  Will plan to discharge with NSAIDs as well as muscle relaxers given reported associated  muscle spasms.  Encouraged primary care follow-up.  Return precautions discussed and provided. Patient discharged in stable condition  with no unaddressed concerns.   Final Clinical Impression(s) / ED Diagnoses Final diagnoses:  Acute left-sided low back pain with left-sided sciatica    Rx / DC Orders ED Discharge Orders         Ordered    naproxen (NAPROSYN) 500 MG tablet  2 times daily with meals        07/21/20 1940    methocarbamol (ROBAXIN) 500 MG tablet  Every 12 hours PRN        07/21/20 1940           Antony Madura, Cordelia Poche 07/21/20 2019    Eber Hong, MD 07/22/20 [phone]

## 2020-07-21 NOTE — Discharge Instructions (Signed)
Alternate ice and heat to areas of injury 3-4 times per day to limit inflammation and spasm.  Avoid strenuous activity and heavy lifting.  We recommend consistent use of naproxen in addition to Robaxin for muscle spasms. Do not drive or drink alcohol after taking Robaxin as it may make you drowsy and impair your judgment.  We recommend follow-up with a primary care doctor to ensure resolution of symptoms.  Return to the ED for any new or concerning symptoms. 

## 2020-07-21 NOTE — ED Triage Notes (Signed)
Back pain x 3 weeks

## 2020-07-22 ENCOUNTER — Telehealth: Payer: Self-pay | Admitting: *Deleted

## 2020-07-22 DIAGNOSIS — Z0000 Encounter for general adult medical examination without abnormal findings: Secondary | ICD-10-CM

## 2020-07-22 NOTE — Telephone Encounter (Signed)
Transition Care Management Unsuccessful Follow-up Telephone Call  Date of discharge and from where:  07/21/2020 - [facility]  Attempts:  1st Attempt  Reason for unsuccessful TCM follow-up call:  Left voice message   Referral will need to be made for PCP, will wait to put in referral until I speak with the pt.

## 2020-07-25 DIAGNOSIS — F439 Reaction to severe stress, unspecified: Secondary | ICD-10-CM | POA: Diagnosis not present

## 2020-07-25 DIAGNOSIS — F411 Generalized anxiety disorder: Secondary | ICD-10-CM | POA: Diagnosis not present

## 2020-07-25 DIAGNOSIS — F330 Major depressive disorder, recurrent, mild: Secondary | ICD-10-CM | POA: Diagnosis not present

## 2020-07-25 NOTE — Telephone Encounter (Signed)
Transition Care Management Follow-up Telephone Call  Date of discharge and from where: 07/21/2020 from [facility]  How have you been since you were released from the hospital? Pt stated that she is feeling much better and has no questions or concerns.   Any questions or concerns? No  Items Reviewed:  Did the pt receive and understand the discharge instructions provided? Yes   Medications obtained and verified? Yes   Other? No   Any new allergies since your discharge? No   Dietary orders reviewed? N/A  Do you have support at home? Yes   Functional Questionnaire: (I = Independent and D = Dependent) ADLs: I  Bathing/Dressing- I  Meal Prep- I  Eating- I  Maintaining continence- I  Transferring/Ambulation- I  Managing Meds- I   Follow up appointments reviewed:   PCP Hospital f/u appt confirmed? No    Specialist Hospital f/u appt confirmed? No    Are transportation arrangements needed? No   If their condition worsens, is the pt aware to call PCP or go to the Emergency Dept.? Yes  Was the patient provided with contact information for the PCP's office or ED? Yes  Was to pt encouraged to call back with questions or concerns? Yes

## 2020-08-01 DIAGNOSIS — F330 Major depressive disorder, recurrent, mild: Secondary | ICD-10-CM | POA: Diagnosis not present

## 2020-08-01 DIAGNOSIS — F411 Generalized anxiety disorder: Secondary | ICD-10-CM | POA: Diagnosis not present

## 2020-08-08 ENCOUNTER — Encounter: Payer: Self-pay | Admitting: *Deleted

## 2020-08-22 DIAGNOSIS — F439 Reaction to severe stress, unspecified: Secondary | ICD-10-CM | POA: Diagnosis not present

## 2020-08-22 DIAGNOSIS — F411 Generalized anxiety disorder: Secondary | ICD-10-CM | POA: Diagnosis not present

## 2020-08-22 DIAGNOSIS — F330 Major depressive disorder, recurrent, mild: Secondary | ICD-10-CM | POA: Diagnosis not present

## 2020-09-06 ENCOUNTER — Other Ambulatory Visit: Payer: Self-pay | Admitting: Obstetrics and Gynecology

## 2020-09-07 ENCOUNTER — Other Ambulatory Visit: Payer: Self-pay

## 2020-09-07 ENCOUNTER — Encounter (HOSPITAL_BASED_OUTPATIENT_CLINIC_OR_DEPARTMENT_OTHER): Payer: Self-pay | Admitting: Obstetrics and Gynecology

## 2020-09-12 ENCOUNTER — Other Ambulatory Visit (HOSPITAL_COMMUNITY)
Admission: RE | Admit: 2020-09-12 | Discharge: 2020-09-12 | Disposition: A | Discharge status: Home / Self Care | Payer: Medicaid Other | Source: Ambulatory Visit | Attending: Obstetrics and Gynecology | Admitting: Obstetrics and Gynecology

## 2020-09-12 DIAGNOSIS — Z20822 Contact with and (suspected) exposure to covid-19: Secondary | ICD-10-CM | POA: Insufficient documentation

## 2020-09-12 DIAGNOSIS — Z01812 Encounter for preprocedural laboratory examination: Secondary | ICD-10-CM | POA: Insufficient documentation

## 2020-09-12 LAB — SARS CORONAVIRUS 2 (TAT 6-24 HRS): SARS Coronavirus 2: NEGATIVE

## 2020-09-14 ENCOUNTER — Encounter (HOSPITAL_BASED_OUTPATIENT_CLINIC_OR_DEPARTMENT_OTHER)
Admission: RE | Disposition: A | Discharge status: Home / Self Care | Payer: Self-pay | Source: Home / Self Care | Attending: Obstetrics and Gynecology

## 2020-09-14 ENCOUNTER — Ambulatory Visit (HOSPITAL_BASED_OUTPATIENT_CLINIC_OR_DEPARTMENT_OTHER)
Admission: RE | Admit: 2020-09-14 | Discharge: 2020-09-14 | Disposition: A | Discharge status: Home / Self Care | Payer: Medicaid Other | Attending: Obstetrics and Gynecology | Admitting: Obstetrics and Gynecology

## 2020-09-14 ENCOUNTER — Ambulatory Visit (HOSPITAL_BASED_OUTPATIENT_CLINIC_OR_DEPARTMENT_OTHER): Payer: Medicaid Other | Admitting: Certified Registered Nurse Anesthetist

## 2020-09-14 ENCOUNTER — Other Ambulatory Visit: Payer: Self-pay

## 2020-09-14 ENCOUNTER — Encounter (HOSPITAL_BASED_OUTPATIENT_CLINIC_OR_DEPARTMENT_OTHER): Payer: Self-pay | Admitting: Obstetrics and Gynecology

## 2020-09-14 DIAGNOSIS — Z791 Long term (current) use of non-steroidal anti-inflammatories (NSAID): Secondary | ICD-10-CM | POA: Insufficient documentation

## 2020-09-14 DIAGNOSIS — Z302 Encounter for sterilization: Secondary | ICD-10-CM | POA: Insufficient documentation

## 2020-09-14 DIAGNOSIS — Z9889 Other specified postprocedural states: Secondary | ICD-10-CM

## 2020-09-14 DIAGNOSIS — Z87891 Personal history of nicotine dependence: Secondary | ICD-10-CM | POA: Diagnosis not present

## 2020-09-14 DIAGNOSIS — Z79899 Other long term (current) drug therapy: Secondary | ICD-10-CM | POA: Diagnosis not present

## 2020-09-14 HISTORY — PX: LAPAROSCOPIC TUBAL LIGATION: SHX1937

## 2020-09-14 HISTORY — DX: Lyme disease, unspecified: A69.20

## 2020-09-14 LAB — POCT PREGNANCY, URINE: Preg Test, Ur: NEGATIVE

## 2020-09-14 SURGERY — LIGATION, FALLOPIAN TUBE, LAPAROSCOPIC
Anesthesia: General | Site: Uterus | Laterality: Bilateral

## 2020-09-14 MED ORDER — OXYTOCIN 10 UNIT/ML IJ SOLN
INTRAMUSCULAR | Status: AC
  Filled 2020-09-14: qty 1

## 2020-09-14 MED ORDER — PROPOFOL 500 MG/50ML IV EMUL
INTRAVENOUS | Status: AC
  Filled 2020-09-14: qty 50

## 2020-09-14 MED ORDER — LIDOCAINE 2% (20 MG/ML) 5 ML SYRINGE
INTRAMUSCULAR | Status: AC
  Filled 2020-09-14: qty 5

## 2020-09-14 MED ORDER — MIDAZOLAM HCL 2 MG/2ML IJ SOLN
INTRAMUSCULAR | Status: AC
  Filled 2020-09-14: qty 2

## 2020-09-14 MED ORDER — ROCURONIUM BROMIDE 10 MG/ML (PF) SYRINGE
PREFILLED_SYRINGE | INTRAVENOUS | Status: AC
  Filled 2020-09-14: qty 10

## 2020-09-14 MED ORDER — OXYCODONE-ACETAMINOPHEN 5-325 MG PO TABS: 1.0000 | ORAL_TABLET | Freq: Four times a day (QID) | ORAL | 0 refills | Status: DC | PRN

## 2020-09-14 MED ORDER — MISOPROSTOL 200 MCG PO TABS
ORAL_TABLET | ORAL | Status: AC
  Filled 2020-09-14: qty 5

## 2020-09-14 MED ORDER — KETOROLAC TROMETHAMINE 30 MG/ML IJ SOLN
INTRAMUSCULAR | Status: DC | PRN
  Administered 2020-09-14: 30 mg via INTRAVENOUS

## 2020-09-14 MED ORDER — SILVER NITRATE-POT NITRATE 75-25 % EX MISC
CUTANEOUS | Status: DC | PRN
  Administered 2020-09-14: 2

## 2020-09-14 MED ORDER — PROPOFOL 500 MG/50ML IV EMUL
INTRAVENOUS | Status: DC | PRN
  Administered 2020-09-14: 25 ug/kg/min via INTRAVENOUS

## 2020-09-14 MED ORDER — SUGAMMADEX SODIUM 200 MG/2ML IV SOLN
INTRAVENOUS | Status: DC | PRN
  Administered 2020-09-14: 190 mg via INTRAVENOUS

## 2020-09-14 MED ORDER — SILVER NITRATE-POT NITRATE 75-25 % EX MISC
CUTANEOUS | Status: AC
  Filled 2020-09-14: qty 10

## 2020-09-14 MED ORDER — ROCURONIUM BROMIDE 100 MG/10ML IV SOLN
INTRAVENOUS | Status: DC | PRN
  Administered 2020-09-14: 50 mg via INTRAVENOUS

## 2020-09-14 MED ORDER — DEXAMETHASONE SODIUM PHOSPHATE 10 MG/ML IJ SOLN
INTRAMUSCULAR | Status: AC
  Filled 2020-09-14: qty 1

## 2020-09-14 MED ORDER — FENTANYL CITRATE (PF) 100 MCG/2ML IJ SOLN: 25.0000 ug | INTRAMUSCULAR | Status: DC | PRN

## 2020-09-14 MED ORDER — ARTIFICIAL TEARS OPHTHALMIC OINT
TOPICAL_OINTMENT | OPHTHALMIC | Status: AC
  Filled 2020-09-14: qty 3.5

## 2020-09-14 MED ORDER — SUGAMMADEX SODIUM 200 MG/2ML IV SOLN: INTRAVENOUS | Status: DC | PRN

## 2020-09-14 MED ORDER — GLYCOPYRROLATE 0.2 MG/ML IJ SOLN
INTRAMUSCULAR | Status: AC
  Filled 2020-09-14: qty 2

## 2020-09-14 MED ORDER — KETOROLAC TROMETHAMINE 30 MG/ML IJ SOLN
INTRAMUSCULAR | Status: AC
  Filled 2020-09-14: qty 1

## 2020-09-14 MED ORDER — GLYCOPYRROLATE 0.2 MG/ML IJ SOLN
INTRAMUSCULAR | Status: DC | PRN
  Administered 2020-09-14: .1 mg via INTRAVENOUS

## 2020-09-14 MED ORDER — LACTATED RINGERS IV SOLN: INTRAVENOUS | Status: DC

## 2020-09-14 MED ORDER — FENTANYL CITRATE (PF) 100 MCG/2ML IJ SOLN
INTRAMUSCULAR | Status: AC
  Filled 2020-09-14: qty 2

## 2020-09-14 MED ORDER — ONDANSETRON HCL 4 MG/2ML IJ SOLN
INTRAMUSCULAR | Status: AC
  Filled 2020-09-14: qty 2

## 2020-09-14 MED ORDER — BUPIVACAINE HCL (PF) 0.5 % IJ SOLN
INTRAMUSCULAR | Status: AC
  Filled 2020-09-14: qty 30

## 2020-09-14 MED ORDER — ARTIFICIAL TEARS OPHTHALMIC OINT
TOPICAL_OINTMENT | OPHTHALMIC | Status: DC | PRN
  Administered 2020-09-14: 1 via OPHTHALMIC

## 2020-09-14 MED ORDER — ONDANSETRON HCL 4 MG/2ML IJ SOLN
INTRAMUSCULAR | Status: DC | PRN
  Administered 2020-09-14: 4 mg via INTRAVENOUS

## 2020-09-14 MED ORDER — BUPIVACAINE HCL (PF) 0.25 % IJ SOLN
INTRAMUSCULAR | Status: AC
  Filled 2020-09-14: qty 30

## 2020-09-14 MED ORDER — PROPOFOL 10 MG/ML IV BOLUS
INTRAVENOUS | Status: DC | PRN
  Administered 2020-09-14: 120 mg via INTRAVENOUS

## 2020-09-14 MED ORDER — MIDAZOLAM HCL 2 MG/2ML IJ SOLN
INTRAMUSCULAR | Status: DC | PRN
  Administered 2020-09-14 (×2): 1 mg via INTRAVENOUS

## 2020-09-14 MED ORDER — ACETAMINOPHEN 500 MG PO TABS
1000.0000 mg | ORAL_TABLET | Freq: Once | ORAL | Status: AC
  Administered 2020-09-14: 1000 mg via ORAL

## 2020-09-14 MED ORDER — FENTANYL CITRATE (PF) 100 MCG/2ML IJ SOLN
INTRAMUSCULAR | Status: DC | PRN
  Administered 2020-09-14: 100 ug via INTRAVENOUS

## 2020-09-14 MED ORDER — BUPIVACAINE HCL 0.5 % IJ SOLN
INTRAMUSCULAR | Status: DC | PRN
  Administered 2020-09-14: 20 mL

## 2020-09-14 MED ORDER — DEXAMETHASONE SODIUM PHOSPHATE 10 MG/ML IJ SOLN
INTRAMUSCULAR | Status: DC | PRN
  Administered 2020-09-14: 5 mg via INTRAVENOUS

## 2020-09-14 MED ORDER — IBUPROFEN 600 MG PO TABS: 600.0000 mg | ORAL_TABLET | Freq: Four times a day (QID) | ORAL | 0 refills | Status: DC | PRN

## 2020-09-14 MED ORDER — LIDOCAINE HCL (CARDIAC) PF 100 MG/5ML IV SOSY
PREFILLED_SYRINGE | INTRAVENOUS | Status: DC | PRN
  Administered 2020-09-14: 50 mg via INTRAVENOUS

## 2020-09-14 MED ORDER — ACETAMINOPHEN 500 MG PO TABS
ORAL_TABLET | ORAL | Status: AC
  Filled 2020-09-14: qty 2

## 2020-09-14 MED ORDER — DOCUSATE SODIUM 100 MG PO CAPS: 100.0000 mg | ORAL_CAPSULE | Freq: Two times a day (BID) | ORAL | 2 refills | Status: AC

## 2020-09-14 MED ORDER — LIDOCAINE HCL (PF) 1 % IJ SOLN
INTRAMUSCULAR | Status: AC
  Filled 2020-09-14: qty 30

## 2020-09-14 SURGICAL SUPPLY — 29 items
ADH SKN CLS APL DERMABOND .7 (GAUZE/BANDAGES/DRESSINGS) ×1
BLADE SURG 15 STRL LF DISP TIS (BLADE) ×1 IMPLANT
BLADE SURG 15 STRL SS (BLADE) ×2
CATH ROBINSON RED A/P 14FR (CATHETERS) IMPLANT
DERMABOND ADVANCED (GAUZE/BANDAGES/DRESSINGS) ×1
DERMABOND ADVANCED .7 DNX12 (GAUZE/BANDAGES/DRESSINGS) ×1 IMPLANT
DURAPREP 26ML APPLICATOR (WOUND CARE) ×2 IMPLANT
GAUZE 4X4 16PLY RFD (DISPOSABLE) ×2 IMPLANT
GLOVE SURG POLYISO LF SZ7 (GLOVE) ×2 IMPLANT
GLOVE SURG UNDER POLY LF SZ7 (GLOVE) ×4 IMPLANT
GLOVE SURG UNDER POLY LF SZ7.5 (GLOVE) ×2 IMPLANT
GOWN STRL REUS W/TWL LRG LVL3 (GOWN DISPOSABLE) ×2 IMPLANT
GOWN STRL REUS W/TWL XL LVL3 (GOWN DISPOSABLE) ×2 IMPLANT
PACK LAPAROSCOPY BASIN (CUSTOM PROCEDURE TRAY) ×2 IMPLANT
PACK TRENDGUARD 450 HYBRID PRO (MISCELLANEOUS) ×1 IMPLANT
PAD OB MATERNITY 4.3X12.25 (PERSONAL CARE ITEMS) ×2 IMPLANT
PAD PREP 24X48 CUFFED NSTRL (MISCELLANEOUS) ×2 IMPLANT
SET TUBE SMOKE EVAC HIGH FLOW (TUBING) ×2 IMPLANT
SLEEVE SCD COMPRESS KNEE MED (STOCKING) ×2 IMPLANT
SUT MNCRL AB 4-0 PS2 18 (SUTURE) ×2 IMPLANT
SUT VICRYL 0 UR6 27IN ABS (SUTURE) ×2 IMPLANT
TOWEL GREEN STERILE FF (TOWEL DISPOSABLE) ×2 IMPLANT
TRENDGUARD 450 HYBRID PRO PACK (MISCELLANEOUS) ×2
TROCAR 5M 150ML BLDLS (TROCAR) ×4 IMPLANT
TROCAR BALLN 12MMX100 BLUNT (TROCAR) ×2 IMPLANT
TROCAR BALLN GELPORT 12X130M (ENDOMECHANICALS) IMPLANT
TROCAR OPTI TIP 5M 100M (ENDOMECHANICALS) ×2 IMPLANT
TROCAR XCEL NON-BLD 11X100MML (ENDOMECHANICALS) IMPLANT
WARMER LAPAROSCOPE (MISCELLANEOUS) ×2 IMPLANT

## 2020-09-14 NOTE — H&P (Signed)
Obstetrics & Gynecology Surgical H&P   Date of Surgery: 09/14/2020    Primary OBGYN: [facility]  Reason for Admission: scheduled BTL  History of Present Illness: Sue Shepherd is a 30 y.o. [id number], with the above CC. PMHx is significant for nothing.  Patient had AUB with paragard and u/s showed migration sow it was removed in mid March 2022. Options d/w and she wanted her tubes tied.  Patient not having any AUB since Paragard out  ROS: A 12-point review of systems was performed and negative, except as stated in the above HPI.  OBGYN History: As per HPI. OB History  Gravida Para Term Preterm AB Living  3 2 2   1 2   SAB IAB Ectopic Multiple Live Births  1     0 2    # Outcome Date GA Lbr Len/2nd Weight Sex Delivery Anes PTL Lv  3 Term 07/27/18 [redacted]w[redacted]d 07:32 / 00:17 3130 g F Vag-Spont EPI  LIV  2 Term 2013   3033 g  Vag-Spont   LIV  1 SAB 11/03/10 [redacted]w[redacted]d       DEC     Past Medical History: Past Medical History:  Diagnosis Date  . Anxiety   . Depression   . Lyme disease   . Malpositioned intrauterine device 07/13/2020    Past Surgical History: Past Surgical History:  Procedure Laterality Date  . IUD REMOVAL  07/19/2020      . WISDOM TOOTH EXTRACTION      Family History:  Family History  Problem Relation Age of Onset  . Arthritis Paternal Grandfather   . Anxiety disorder Paternal Grandfather   . Heart disease Paternal Grandfather   . Anxiety disorder Sister   . Depression Sister   . Crohn's disease Paternal Uncle   . Diabetes Maternal Grandfather   . Dementia Paternal Grandmother     Social History:  Social History   Socioeconomic History  . Marital status: Single    Spouse name: Not on file  . Number of children: Not on file  . Years of education: Not on file  . Highest education level: Not on file  Occupational History  . Not on file  Tobacco Use  . Smoking status: Former Smoker    Types: Cigarettes    Quit date: 12/04/2017     Years since quitting: 2.7  . Smokeless tobacco: Never Used  Vaping Use  . Vaping Use: Never used  Substance and Sexual Activity  . Alcohol use: Not Currently  . Drug use: Yes    Types: Marijuana    Comment: 09-11-20 last  . Sexual activity: Yes    Birth control/protection: Injection  Other Topics Concern  . Not on file  Social History Narrative  . Not on file   Social Determinants of Health   Financial Resource Strain: Not on file  Food Insecurity: Not on file  Transportation Needs: Not on file  Physical Activity: Not on file  Stress: Not on file  Social Connections: Not on file  Intimate Partner Violence: Not on file     Allergy: No Known Allergies  Current Outpatient Medications: Medications Prior to Admission  Medication Sig Dispense Refill Last Dose  . buPROPion (WELLBUTRIN XL) 150 MG 24 hr tablet Take 150 mg by mouth daily.   09/13/2020 at Unknown time  . busPIRone (BUSPAR) 10 MG tablet Take 10 mg by mouth 3 (three) times daily.   09/13/2020 at Unknown time  . methocarbamol (ROBAXIN) 500 MG tablet  Take 1 tablet (500 mg total) by mouth every 12 (twelve) hours as needed for muscle spasms. 15 tablet 0 More than a month at Unknown time  . naproxen (NAPROSYN) 500 MG tablet Take 1 tablet (500 mg total) by mouth 2 (two) times daily with a meal. 30 tablet 0 More than a month at Unknown time     Hospital Medications: Current Facility-Administered Medications  Medication Dose Route Frequency Provider Last Rate Last Admin  . acetaminophen (TYLENOL) tablet 1,000 mg  1,000 mg Oral Once Woodrum, Chelsey L, MD      . lactated ringers infusion   Intravenous Continuous Howze, Jaclynn Major, MD      . lactated ringers infusion   Intravenous Continuous [place] Bing, MD         Physical Exam:  Current Vital Signs 24h Vital Sign Ranges  T (!) 97.5 F (36.4 C) Temp  Avg: 97.5 F (36.4 C)  Min: 97.5 F (36.4 C)  Max: 97.5 F (36.4 C)  BP (!) 93/56 BP  Min: 93/56  Max: 93/56  HR  83 Pulse  Avg: 83  Min: 83  Max: 83  RR 20 Resp  Avg: 20  Min: 20  Max: 20  SaO2 100 % Room Air SpO2  Avg: 100 %  Min: 100 %  Max: 100 %       24 Hour I/O Current Shift I/O  Time Ins Outs No intake/output data recorded. No intake/output data recorded.    Body mass index is 20.79 kg/m. General appearance: Well nourished, well developed female in no acute distress.  Cardiovascular: S1, S2 normal, no murmur, rub or gallop, regular rate and rhythm Respiratory:  Clear to auscultation bilateral. Normal respiratory effort Abdomen: positive bowel sounds and no masses, hernias; diffusely non tender to palpation, non distended Neuro/Psych:  Normal mood and affect.  Skin:  Warm and dry.  Extremities: no clubbing, cyanosis, or edema.    Laboratory: UPT: neg COVID: neg  Imaging:  none  Assessment: Sue Shepherd is a 30 y.o. [id number] (Patient's last menstrual period was 07/08/2020.) here for BTL and doing well.  Plan: Options d/w her and she would like to have a bilateral salpingectomy instead of filshie due to not wanting anything foreign in her and concern for migration like with the filshie. R/b of this vs filshie single site d/w her and she opts for salpingectomy. Can proceed to OR when they are ready.    Cornelia Copa MD Attending [facility])

## 2020-09-14 NOTE — Op Note (Signed)
Operative Note   09/14/2020  PRE-OP DIAGNOSIS: Desire for permanent sterilization   POST-OP DIAGNOSIS: Same   SURGEON: Surgeon(s) and Role:    [place] Bing, MD - Primary  ASSISTANT: None  ANESTHESIA: General and local  PROCEDURE: Laparoscopic bilateral salpingectomy  ESTIMATED BLOOD LOSS: 35mL  DRAINS: patient voided just prior to procedure   TOTAL IV FLUIDS: per anesthesia note  SPECIMENS: bilateral tubes to pathology   VTE PROPHYLAXIS: SCDs to the bilateral lower extremities  ANTIBIOTICS: not indicated  COMPLICATIONS: None  DISPOSITION: PACU - hemodynamically stable.  CONDITION: stable  FINDINGS: Exam under anesthesia revealed an anteverted uterus approximately 6 week size, normal shape, and no adnexal masses. Laparoscopic survey of the abdomen revealed a grossly normal uterus, tubes, ovaries, liver edge, gallbladder edge, and stomach edge; no intra-abdominal adhesions were noted  PROCEDURE IN DETAIL: The patient was taken to the OR where anesthesia was administed. The patient was positioned in dorsal lithotomy in the [place] stirrups. The patient was then examined under anesthesia with the above noted findings. The patient was prepped and draped in the normal sterile fashion and foley catheter was placed. A Graves speculum was placed in the vagina and the anterior lip of the cervix was grasped with a single toothed tenaculum.  A Hulka uterine manipulator was then inserted in the uterus and uterine mobility was found to be satisfactory; the speculum and tenaculum were then removed.  After changing gloves, attention was turned to the patient's abdomen where a 12 mm skin incision was made in the umbilical fold, after injection of local anesthesia. The open technique was used to enter the abdomen and pneumoperitoneum was obtained. The port was placed and then the operative laparoscope was introduced into the abdomen with the above noted findings, after inspection of the entry  site and then placing the patient in Trendelenburg. Next, after injection of local anesthsia a 50mm LLQ and suprapubic port were placed under direct visualization and the bilateral tubes were traced out to their respective fimbriae, with the above noted findings. Next, the ligasure was used to serially cauterize and then transect the mesosalpingx, to the cornua then the tube was transected at the cornua; both tubes were then removed from the abdomen via the port sites.  The pressure was then lowered to and excellent hemostasis was noted.  The 79mm ports were then removed under direct visualization and the gas released and the balloon port was then removed.   The fascia at the umbilical incision was reapproximated with 0 vicryl. The skin was closed with 4-0 monocryl and dermabond and dermabond only at the 16mm sites. The Hulka was removed with no bleeding noted from the cervix and all other instrumentation was removed from the vagina.  The foley catheter was removed. The patient tolerated the procedure well. All counts were correct x 2. The patient was transferred to the recovery room awake, alert and breathing independently.   Cornelia Copa MD Attending Center for [facility])

## 2020-09-14 NOTE — Anesthesia Procedure Notes (Signed)
Procedure Name: Intubation Date/Time: 09/14/2020 8:51 AM Performed by: Collier Bullock, CRNA Pre-anesthesia Checklist: Patient identified, Emergency Drugs available, Suction available and Patient being monitored Patient Re-evaluated:Patient Re-evaluated prior to induction Oxygen Delivery Method: Circle system utilized Preoxygenation: Pre-oxygenation with 100% oxygen Induction Type: IV induction Ventilation: Mask ventilation without difficulty Laryngoscope Size: Mac and 4 Grade View: Grade II Tube type: Oral Tube size: 7.0 mm Number of attempts: 1 Airway Equipment and Method: Stylet Placement Confirmation: ETT inserted through vocal cords under direct vision,  positive ETCO2 and breath sounds checked- equal and bilateral Secured at: 22 cm Tube secured with: Tape Dental Injury: Teeth and Oropharynx as per pre-operative assessment

## 2020-09-14 NOTE — Transfer of Care (Signed)
Immediate Anesthesia Transfer of Care Note  Patient: DOAA KENDZIERSKI  Procedure(s) Performed: LAPAROSCOPIC TUBAL LIGATION (Bilateral Uterus)  Patient Location: PACU  Anesthesia Type:General  Level of Consciousness: awake, oriented and patient cooperative  Airway & Oxygen Therapy: Patient Spontanous Breathing and Patient connected to face mask oxygen  Post-op Assessment: Report given to RN, Post -op Vital signs reviewed and stable, Patient moving all extremities X 4 and Patient able to stick tongue midline  Post vital signs: Reviewed and stable  Last Vitals:  Vitals Value Taken Time  BP 112/71 09/14/20 0947  Temp    Pulse 90 09/14/20 0949  Resp 13 09/14/20 0949  SpO2 100 % 09/14/20 0949  Vitals shown include unvalidated device data.  Last Pain:  Vitals:   09/14/20 0716  TempSrc: Oral  PainSc: 0-No pain      Patients Stated Pain Goal: 6 (09/14/20 0716)  Complications: No complications documented.

## 2020-09-14 NOTE — Anesthesia Postprocedure Evaluation (Signed)
Anesthesia Post Note  Patient: [facility]  Procedure(s) Performed: LAPAROSCOPIC TUBAL LIGATION (Bilateral Uterus)     Patient location during evaluation: PACU Anesthesia Type: General Level of consciousness: awake and alert Pain management: pain level controlled Vital Signs Assessment: post-procedure vital signs reviewed and stable Respiratory status: spontaneous breathing, nonlabored ventilation, respiratory function stable and patient connected to nasal cannula oxygen Cardiovascular status: blood pressure returned to baseline and stable Postop Assessment: no apparent nausea or vomiting Anesthetic complications: no   No complications documented.  Last Vitals:  Vitals:   09/14/20 1000 09/14/20 1030  BP:  110/77  Pulse: 92 87  Resp: 15 12  Temp:  36.5 C  SpO2: 99% 100%    Last Pain:  Vitals:   09/14/20 1030  TempSrc:   PainSc: 0-No pain                 Meron Bocchino L Debbrah Sampedro

## 2020-09-14 NOTE — Discharge Instructions (Signed)
*No Ibuprofen until after 3:30 pm *No tylenol until after 2:15 pm   Post Anesthesia Home Care Instructions  Activity: Get plenty of rest for the remainder of the day. A responsible individual must stay with you for 24 hours following the procedure.  For the next 24 hours, DO NOT: -Drive a car -Advertising copywriter -Drink alcoholic beverages -Take any medication unless instructed by your physician -Make any legal decisions or sign important papers.  Meals: Start with liquid foods such as gelatin or soup. Progress to regular foods as tolerated. Avoid greasy, spicy, heavy foods. If nausea and/or vomiting occur, drink only clear liquids until the nausea and/or vomiting subsides. Call your physician if vomiting continues.  Special Instructions/Symptoms: Your throat may feel dry or sore from the anesthesia or the breathing tube placed in your throat during surgery. If this causes discomfort, gargle with warm salt water. The discomfort should disappear within 24 hours.  If you had a scopolamine patch placed behind your ear for the management of post- operative nausea and/or vomiting:  1. The medication in the patch is effective for 72 hours, after which it should be removed.  Wrap patch in a tissue and discard in the trash. Wash hands thoroughly with soap and water. 2. You may remove the patch earlier than 72 hours if you experience unpleasant side effects which may include dry mouth, dizziness or visual disturbances. 3. Avoid touching the patch. Wash your hands with soap and water after contact with the patch.    Laparoscopic Surgery Discharge Instructions  Instructions Following  Laparoscopic Surgery You have just undergone a laparoscopic surgery.  The following list should answer your most common questions.  Although we will discuss your surgery and post-operative instructions with you prior to your discharge, this list will serve as a reminder if you fail to recall the details of what we  discussed.  We will discuss your surgery once again in detail at your post-op visit in two to four weeks. If you haven't already done so, please call to make your appointment as soon as possible.  How you will feel: Although you have just undergone a major surgery, your recovery will be significantly shorter since the surgery was performed through much smaller incisions than the traditional approach.  You should feel slightly better each day.  If you suddenly feel much worse than the prior day, please call the clinic.  It's important during the early part of your recovery that you maintain some activity.  Walking is encouraged.  You will quicken your recovery by continued activity.  Incision:  Your incisions will be closed with dissolvable stitches or surgical adhesive (glue).  There may be Band-aids and/or Steri-strips covering your incisions.  If there is no drainage from the incisions you may remove the Band-aids in one to two days.  You may notice some minor bruising at the incision sites.  This is common and will resolve within several days.  Please inform us if the redness at the edges of your incision appears to be spreading.  If the skin around your incision becomes warm to the touch, or if you notice a pus-like drainage, please call the office.  Stairs/Driving/Activities: You may climb stairs if necessary.  If you've had general anesthesia, do not drive a car the rest of the day today.  You may begin light housework when you feel up to it, but avoid heavy lifting (more than 15-20lbs) or pushing until cleared for these activities by your physician.  Hygiene:  Do not soak your incisions.  Showers are acceptable but you may not take a bath or swim in a pool.  Cleanse your incisions daily with soap and water.  Medications:  Please resume taking any medications that you were taking prior to the surgery.  If we have prescribed any new medications for you, please take them as directed.  Constipation:   It is fairly common to experience some difficulty in moving your bowels following major surgery.  Being active will help to reduce this likelihood. A diet rich in fiber and plenty of liquids is desirable.  If you do become constipated, a mild laxative such as Miralax, Milk of Magnesia, or Metamucil, or a stool softener such as Colace, is recommended.  General Instructions: If you develop a fever of 100.5 degrees or higher, please call the office number(s) below for physician on call.

## 2020-09-14 NOTE — Brief Op Note (Signed)
09/14/2020  9:42 AM  PATIENT:  Sue Shepherd  30 y.o. female  PRE-OPERATIVE DIAGNOSIS:  Undesired fertility  POST-OPERATIVE DIAGNOSIS:  Undesired fertility  PROCEDURE:  Laparoscopic bilateral salpingectomy  SURGEON:  Surgeon(s) and Role:    * [place] Bing, MD - Primary  PHYSICIAN ASSISTANT:   ASSISTANTS: none   ANESTHESIA:   general  EBL:  5 mL   BLOOD ADMINISTERED:none  DRAINS: none   LOCAL MEDICATIONS USED:  MARCAINE     SPECIMEN:  Bilateral fallopian tubes  DISPOSITION OF SPECIMEN:  PATHOLOGY  COUNTS:  YES  TOURNIQUET:  * No tourniquets in log *  DICTATION: .Note written in EPIC  PLAN OF CARE: Discharge to home after PACU  PATIENT DISPOSITION:  PACU - hemodynamically stable.   Delay start of Pharmacological VTE agent (>24hrs) due to surgical blood loss or risk of bleeding: not applicable  Cornelia Copa MD Attending Center for [facility])

## 2020-09-14 NOTE — Anesthesia Preprocedure Evaluation (Addendum)
Anesthesia Evaluation  Patient identified by MRN, date of birth, ID band Patient awake    Reviewed: Allergy & Precautions, NPO status , Patient's Chart, lab work & pertinent test results  Airway Mallampati: I  TM Distance: >3 FB Neck ROM: Full    Dental no notable dental hx. (+) Teeth Intact, Dental Advisory Given   Pulmonary neg pulmonary ROS, former smoker,    Pulmonary exam normal breath sounds clear to auscultation       Cardiovascular negative cardio ROS Normal cardiovascular exam Rhythm:Regular Rate:Normal     Neuro/Psych PSYCHIATRIC DISORDERS Anxiety Depression negative neurological ROS     GI/Hepatic negative GI ROS, Neg liver ROS,   Endo/Other  negative endocrine ROS  Renal/GU negative Renal ROS  negative genitourinary   Musculoskeletal negative musculoskeletal ROS (+)   Abdominal   Peds  Hematology negative hematology ROS (+)   Anesthesia Other Findings   Reproductive/Obstetrics                            Anesthesia Physical Anesthesia Plan  ASA: II  Anesthesia Plan: General   Post-op Pain Management:    Induction: Intravenous  PONV Risk Score and Plan: 3 and Midazolam, Dexamethasone and Ondansetron  Airway Management Planned: Oral ETT  Additional Equipment:   Intra-op Plan:   Post-operative Plan: Extubation in OR  Informed Consent: I have reviewed the patients History and Physical, chart, labs and discussed the procedure including the risks, benefits and alternatives for the proposed anesthesia with the patient or authorized representative who has indicated his/her understanding and acceptance.     Dental advisory given  Plan Discussed with: CRNA  Anesthesia Plan Comments:         Anesthesia Quick Evaluation

## 2020-09-15 ENCOUNTER — Encounter (HOSPITAL_BASED_OUTPATIENT_CLINIC_OR_DEPARTMENT_OTHER): Payer: Self-pay | Admitting: Obstetrics and Gynecology

## 2020-09-15 LAB — SURGICAL PATHOLOGY

## 2020-09-20 DIAGNOSIS — K219 Gastro-esophageal reflux disease without esophagitis: Secondary | ICD-10-CM | POA: Diagnosis not present

## 2020-09-20 DIAGNOSIS — R110 Nausea: Secondary | ICD-10-CM | POA: Diagnosis not present

## 2020-09-20 DIAGNOSIS — R109 Unspecified abdominal pain: Secondary | ICD-10-CM | POA: Diagnosis not present

## 2020-09-20 DIAGNOSIS — F330 Major depressive disorder, recurrent, mild: Secondary | ICD-10-CM | POA: Diagnosis not present

## 2020-09-20 DIAGNOSIS — F439 Reaction to severe stress, unspecified: Secondary | ICD-10-CM | POA: Diagnosis not present

## 2020-09-20 DIAGNOSIS — F411 Generalized anxiety disorder: Secondary | ICD-10-CM | POA: Diagnosis not present

## 2020-09-20 DIAGNOSIS — R634 Abnormal weight loss: Secondary | ICD-10-CM | POA: Diagnosis not present

## 2020-09-26 DIAGNOSIS — F411 Generalized anxiety disorder: Secondary | ICD-10-CM | POA: Diagnosis not present

## 2020-09-26 DIAGNOSIS — F330 Major depressive disorder, recurrent, mild: Secondary | ICD-10-CM | POA: Diagnosis not present

## 2020-09-27 ENCOUNTER — Encounter: Payer: Self-pay | Admitting: *Deleted

## 2020-10-10 ENCOUNTER — Telehealth: Payer: Self-pay | Admitting: *Deleted

## 2020-10-10 ENCOUNTER — Telehealth: Payer: Medicaid Other | Admitting: Obstetrics and Gynecology

## 2020-10-10 NOTE — Telephone Encounter (Signed)
Attempted to call pt for her mychart visit, no answer and voicemail full

## 2020-10-17 DIAGNOSIS — F330 Major depressive disorder, recurrent, mild: Secondary | ICD-10-CM | POA: Diagnosis not present

## 2020-10-17 DIAGNOSIS — F411 Generalized anxiety disorder: Secondary | ICD-10-CM | POA: Diagnosis not present

## 2020-10-20 ENCOUNTER — Ambulatory Visit (INDEPENDENT_AMBULATORY_CARE_PROVIDER_SITE_OTHER): Payer: Medicaid Other | Admitting: Family Medicine

## 2020-10-20 ENCOUNTER — Other Ambulatory Visit: Payer: Self-pay

## 2020-10-20 ENCOUNTER — Encounter: Payer: Self-pay | Admitting: Family Medicine

## 2020-10-20 VITALS — BP 102/66 | HR 98 | Wt 100.8 lb

## 2020-10-20 DIAGNOSIS — N939 Abnormal uterine and vaginal bleeding, unspecified: Secondary | ICD-10-CM

## 2020-10-20 NOTE — Progress Notes (Signed)
   Subjective:    Patient ID: Sue Shepherd is a 30 y.o. female presenting with No chief complaint on file.  on 10/20/2020  HPI: Reports bleeding since she had her BTL done. Blood seems to be old and looks like it has been in for a while. Prior to this she had Paragard IUD and was having bleeding from this and it was misplaced. Had normal u/s other than IUD in the wrong spot.  Review of Systems  Constitutional:  Negative for chills and fever.  Respiratory:  Negative for shortness of breath.   Cardiovascular:  Negative for chest pain.  Gastrointestinal:  Negative for abdominal pain, nausea and vomiting.  Genitourinary:  Negative for dysuria.  Skin:  Negative for rash.     Objective:    BP 102/66   Pulse 98   Wt 100 lb 12.8 oz (45.7 kg)   BMI 20.36 kg/m  Physical Exam Constitutional:      General: She is not in acute distress.    Appearance: She is well-developed.  HENT:     Head: Normocephalic and atraumatic.  Eyes:     General: No scleral icterus. Cardiovascular:     Rate and Rhythm: Normal rate.  Pulmonary:     Effort: Pulmonary effort is normal.  Abdominal:     Palpations: Abdomen is soft.  Musculoskeletal:     Cervical back: Neck supple.  Skin:    General: Skin is warm and dry.  Neurological:     Mental Status: She is alert and oriented to person, place, and time.        Assessment & Plan:   Problem List Items Addressed This Visit       Unprioritized   Abnormal uterine bleeding (AUB) - Primary    Given one sample pack of pills to help re-set her body. Consider other options if no response.        Return in about 6 weeks (around 12/01/2020) for virtual.  Reva Bores 10/22/2020 2:57 PM

## 2020-10-21 DIAGNOSIS — F439 Reaction to severe stress, unspecified: Secondary | ICD-10-CM | POA: Diagnosis not present

## 2020-10-21 DIAGNOSIS — F330 Major depressive disorder, recurrent, mild: Secondary | ICD-10-CM | POA: Diagnosis not present

## 2020-10-21 DIAGNOSIS — F411 Generalized anxiety disorder: Secondary | ICD-10-CM | POA: Diagnosis not present

## 2020-10-22 NOTE — Assessment & Plan Note (Signed)
Given one sample pack of pills to help re-set her body. Consider other options if no response.

## 2020-10-26 DIAGNOSIS — K219 Gastro-esophageal reflux disease without esophagitis: Secondary | ICD-10-CM | POA: Diagnosis not present

## 2020-10-26 DIAGNOSIS — R112 Nausea with vomiting, unspecified: Secondary | ICD-10-CM | POA: Diagnosis not present

## 2020-10-26 DIAGNOSIS — R109 Unspecified abdominal pain: Secondary | ICD-10-CM | POA: Diagnosis not present

## 2020-10-26 DIAGNOSIS — R194 Change in bowel habit: Secondary | ICD-10-CM | POA: Diagnosis not present

## 2020-10-26 DIAGNOSIS — K30 Functional dyspepsia: Secondary | ICD-10-CM | POA: Diagnosis not present

## 2020-11-09 DIAGNOSIS — K2950 Unspecified chronic gastritis without bleeding: Secondary | ICD-10-CM | POA: Diagnosis not present

## 2020-11-09 DIAGNOSIS — K219 Gastro-esophageal reflux disease without esophagitis: Secondary | ICD-10-CM | POA: Diagnosis not present

## 2020-11-09 DIAGNOSIS — R12 Heartburn: Secondary | ICD-10-CM | POA: Diagnosis not present

## 2020-11-09 DIAGNOSIS — K2970 Gastritis, unspecified, without bleeding: Secondary | ICD-10-CM | POA: Diagnosis not present

## 2020-11-09 DIAGNOSIS — K209 Esophagitis, unspecified without bleeding: Secondary | ICD-10-CM | POA: Diagnosis not present

## 2020-11-10 DIAGNOSIS — R109 Unspecified abdominal pain: Secondary | ICD-10-CM | POA: Diagnosis not present

## 2020-11-14 DIAGNOSIS — F330 Major depressive disorder, recurrent, mild: Secondary | ICD-10-CM | POA: Diagnosis not present

## 2020-11-14 DIAGNOSIS — F411 Generalized anxiety disorder: Secondary | ICD-10-CM | POA: Diagnosis not present

## 2020-11-15 DIAGNOSIS — F330 Major depressive disorder, recurrent, mild: Secondary | ICD-10-CM | POA: Diagnosis not present

## 2020-11-15 DIAGNOSIS — F411 Generalized anxiety disorder: Secondary | ICD-10-CM | POA: Diagnosis not present

## 2020-11-15 DIAGNOSIS — F439 Reaction to severe stress, unspecified: Secondary | ICD-10-CM | POA: Diagnosis not present

## 2020-12-01 ENCOUNTER — Other Ambulatory Visit: Payer: Self-pay

## 2020-12-01 ENCOUNTER — Telehealth (INDEPENDENT_AMBULATORY_CARE_PROVIDER_SITE_OTHER): Payer: Medicaid Other | Admitting: Obstetrics and Gynecology

## 2020-12-01 DIAGNOSIS — N939 Abnormal uterine and vaginal bleeding, unspecified: Secondary | ICD-10-CM

## 2020-12-01 NOTE — Progress Notes (Signed)
     TELEHEALTH GYNECOLOGY VISIT ENCOUNTER NOTE  Provider location: Center for [facility]   Patient location: Home  I connected with Sue Shepherd on 12/01/20 at  2:00 PM EDT by telephone and verified that I am speaking with the correct person using two identifiers. Patient was unable to do MyChart audiovisual encounter due to technical difficulties, she tried several times.    I discussed the limitations, risks, security and privacy concerns of performing an evaluation and management service by telephone and the availability of in person appointments. I also discussed with the patient that there may be a patient responsible charge related to this service. The patient expressed understanding and agreed to proceed.   History:  Sue Shepherd is a 30 y.o. [phone] female being evaluated today for f/u AUB after a one month OCP trial for AUB with paragard IUD and after removal and subsequent l/s BTL. Pt last seenon 6/18. She finished her ocp pack on in early July and had a cycle on 7/12 and no bleeding since then. Pt feels well.      Past Medical History:  Diagnosis Date   Anxiety    Depression    Lyme disease    Malpositioned intrauterine device 07/13/2020   Past Surgical History:  Procedure Laterality Date   IUD REMOVAL  07/19/2020       LAPAROSCOPIC TUBAL LIGATION Bilateral 09/14/2020   Procedure: LAPAROSCOPIC TUBAL LIGATION;  Surgeon: [place] Bing, MD;  Location: [facility];  Service: Gynecology;  Laterality: Bilateral;   WISDOM TOOTH EXTRACTION     The following portions of the patient's history were reviewed and updated as appropriate: allergies, current medications, past family history, past medical history, past social history, past surgical history and problem list.   Review of Systems:  Pertinent items noted in HPI and remainder of comprehensive ROS otherwise negative.  Physical Exam:   General:  Alert, oriented and cooperative.    Mental Status: Normal mood and affect perceived. Normal judgment and thought content.  Physical exam deferred due to nature of the encounter  Labs and Imaging No results found for this or any previous visit (from the past 336 hour(s)). No results found.    Assessment and Plan:     Patient doing well  RTC: PRN  I discussed the assessment and treatment plan with the patient. The patient was provided an opportunity to ask questions and all were answered. The patient agreed with the plan and demonstrated an understanding of the instructions.   The patient was advised to call back or seek an in-person evaluation/go to the ED if the symptoms worsen or if the condition fails to improve as anticipated.  I provided 15 minutes of non-face-to-face time during this encounter.   [place] Bing, MD Center for [facility], [facility]

## 2020-12-06 DIAGNOSIS — F330 Major depressive disorder, recurrent, mild: Secondary | ICD-10-CM | POA: Diagnosis not present

## 2020-12-06 DIAGNOSIS — F439 Reaction to severe stress, unspecified: Secondary | ICD-10-CM | POA: Diagnosis not present

## 2020-12-06 DIAGNOSIS — F411 Generalized anxiety disorder: Secondary | ICD-10-CM | POA: Diagnosis not present

## 2020-12-12 DIAGNOSIS — F330 Major depressive disorder, recurrent, mild: Secondary | ICD-10-CM | POA: Diagnosis not present

## 2020-12-12 DIAGNOSIS — F411 Generalized anxiety disorder: Secondary | ICD-10-CM | POA: Diagnosis not present

## 2020-12-19 DIAGNOSIS — R194 Change in bowel habit: Secondary | ICD-10-CM | POA: Diagnosis not present

## 2020-12-19 DIAGNOSIS — R112 Nausea with vomiting, unspecified: Secondary | ICD-10-CM | POA: Diagnosis not present

## 2020-12-19 DIAGNOSIS — K219 Gastro-esophageal reflux disease without esophagitis: Secondary | ICD-10-CM | POA: Diagnosis not present

## 2020-12-19 DIAGNOSIS — K30 Functional dyspepsia: Secondary | ICD-10-CM | POA: Diagnosis not present

## 2020-12-19 DIAGNOSIS — R109 Unspecified abdominal pain: Secondary | ICD-10-CM | POA: Diagnosis not present

## 2020-12-26 ENCOUNTER — Other Ambulatory Visit: Payer: Self-pay | Admitting: Obstetrics and Gynecology

## 2020-12-26 ENCOUNTER — Telehealth: Payer: Self-pay | Admitting: *Deleted

## 2020-12-26 MED ORDER — NORGESTIMATE-ETH ESTRADIOL 0.25-35 MG-MCG PO TABS: 1.0000 | ORAL_TABLET | Freq: Every day | ORAL | 1 refills | Status: DC

## 2020-12-26 NOTE — Telephone Encounter (Signed)
-----   Message from Izora Gala sent at 12/26/2020 10:15 AM EDT ----- Regarding: Bleeding This pt has been bleeding for the last two weeks everyday, pain is at a 5, no meds taken for this, and no additional discomfort. This is a post op experience that shes having states its been going on since Jan.

## 2020-12-26 NOTE — Telephone Encounter (Signed)
Called patient back after discussing phone message with Dr Vergie Living, he will send in OCP's for her to take and he would like to see her in a few weeks. Pt verbalizes and understands.

## 2020-12-27 DIAGNOSIS — F411 Generalized anxiety disorder: Secondary | ICD-10-CM | POA: Diagnosis not present

## 2020-12-27 DIAGNOSIS — F330 Major depressive disorder, recurrent, mild: Secondary | ICD-10-CM | POA: Diagnosis not present

## 2020-12-27 DIAGNOSIS — F439 Reaction to severe stress, unspecified: Secondary | ICD-10-CM | POA: Diagnosis not present

## 2021-01-10 ENCOUNTER — Ambulatory Visit (INDEPENDENT_AMBULATORY_CARE_PROVIDER_SITE_OTHER): Payer: Medicaid Other | Admitting: Obstetrics and Gynecology

## 2021-01-10 ENCOUNTER — Other Ambulatory Visit: Payer: Self-pay

## 2021-01-10 VITALS — BP 99/65 | HR 81 | Ht 59.0 in | Wt 101.8 lb

## 2021-01-10 DIAGNOSIS — N939 Abnormal uterine and vaginal bleeding, unspecified: Secondary | ICD-10-CM | POA: Diagnosis not present

## 2021-01-10 DIAGNOSIS — F411 Generalized anxiety disorder: Secondary | ICD-10-CM | POA: Diagnosis not present

## 2021-01-10 DIAGNOSIS — F330 Major depressive disorder, recurrent, mild: Secondary | ICD-10-CM | POA: Diagnosis not present

## 2021-01-10 NOTE — Patient Instructions (Signed)
Vaginally Hysterectomy: done vaginally, remove the cervix and uterus and leave both ovaries  Endometrial ablation: the burning procedure  Hysteroscopy, dilation and curettage (D&C): scraping procedure

## 2021-01-12 NOTE — Progress Notes (Signed)
Obstetrics and Gynecology Established Patient Evaluation  Appointment Date: 01/10/2021  OBGYN Clinic: Center for [facility]  Primary Care Provider: None  Chief Complaint: AUB  History of Present Illness: Sue Shepherd is a 30 y.o. [phone] (No LMP recorded. (Menstrual status: Other).), seen for the above chief complaint. Her past medical history is significant for l/s BS on 5/11, cervical stenosis and AUB with paragard IUD, anxiety and depression   She had the Paragard placed by another provider with cytotec done before hand in January 2022.  It was removed in mid March due to AUB and it found to be malpositioned on ultrasound. She had a May l/s BS but has continued AUB that has responded to combined OCPs, which she is currently on again  Review of Systems: Pertinent items noted in HPI and remainder of comprehensive ROS otherwise negative.   Past Medical History:  Past Medical History:  Diagnosis Date   Anxiety    Depression    Lyme disease    Malpositioned intrauterine device 07/13/2020    Past Surgical History:  Past Surgical History:  Procedure Laterality Date   IUD REMOVAL  07/19/2020       LAPAROSCOPIC TUBAL LIGATION Bilateral 09/14/2020   Procedure: LAPAROSCOPIC TUBAL LIGATION;  Surgeon: Sue Bing, MD;  Location: [facility];  Service: Gynecology;  Laterality: Bilateral;   WISDOM TOOTH EXTRACTION      Past Obstetrical History:  OB History  Gravida Para Term Preterm AB Living  3 2 2   1 2   SAB IAB Ectopic Multiple Live Births  1     0 2    # Outcome Date GA Lbr Len/2nd Weight Sex Delivery Anes PTL Lv  3 Term 07/27/18 [redacted]w[redacted]d 07:32 / 00:17 6 lb 14.4 oz (3.13 kg) F Vag-Spont EPI  LIV  2 Term 2013   6 lb 11 oz (3.033 kg)  Vag-Spont   LIV  1 SAB 11/03/10 [redacted]w[redacted]d       DEC    Past Gynecological History: As per HPI. History of Pap Smear(s): Yes.   Last pap 05/2020, which was negative with negative STI testing  Social History:  Social  History   Socioeconomic History   Marital status: Single    Spouse name: Not on file   Number of children: Not on file   Years of education: Not on file   Highest education level: Not on file  Occupational History   Not on file  Tobacco Use   Smoking status: Former    Types: Cigarettes    Quit date: 12/04/2017    Years since quitting: 3.1   Smokeless tobacco: Never  Vaping Use   Vaping Use: Never used  Substance and Sexual Activity   Alcohol use: Not Currently   Drug use: Yes    Types: Marijuana    Comment: 09-11-20 last   Sexual activity: Yes    Birth control/protection: Injection  Other Topics Concern   Not on file  Social History Narrative   Not on file   Social Determinants of Health   Financial Resource Strain: Not on file  Food Insecurity: Not on file  Transportation Needs: Not on file  Physical Activity: Not on file  Stress: Not on file  Social Connections: Not on file  Intimate Partner Violence: Not on file    Family History:  Family History  Problem Relation Age of Onset   Arthritis Paternal Grandfather    Anxiety disorder Paternal Grandfather    Heart disease  Paternal Grandfather    Anxiety disorder Sister    Depression Sister    Crohn's disease Paternal Uncle    Diabetes Maternal Grandfather    Dementia Paternal Grandmother     Medications Sue Shepherd had no medications administered during this visit. Current Outpatient Medications  Medication Sig Dispense Refill   norgestimate-ethinyl estradiol (ORTHO-CYCLEN) 0.25-35 MG-MCG tablet Take 1 tablet by mouth daily. 28 tablet 1   No current facility-administered medications for this visit.    Allergies Patient has no known allergies.   Physical Exam:  BP 99/65   Pulse 81   Ht 4\' 11"  (1.499 m)   Wt 101 lb 12.8 oz (46.2 kg)   BMI 20.56 kg/m  Body mass index is 20.56 kg/m. General appearance: Well nourished, well developed female in no acute distress.  Cardiovascular: normal s1 and s2.   No murmurs, rubs or gallops. Respiratory:  Clear to auscultation bilateral. Normal respiratory effort Abdomen: positive bowel sounds and no masses, hernias; diffusely non tender to palpation, non distended Neuro/Psych:  Normal mood and affect.  Skin:  Warm and dry.   Laboratory: TSH normal in 2021  Radiology: none  Assessment: pt stable  Plan:  1. Abnormal uterine bleeding (AUB) Options d/w her and I told her that with medical management she can contiue on the ocps indefinitely or could try a longer method such as depo provera.  With sugery, endometrial ablation and hysterectomy d/w her. I told her that abalation has about an 80% success rate of giving her a normal monthly period but likely to wear off around the 3-5 year mark and then needing a hysterectomy then.  As for hysterectomy, I told her that this is major surgery and longer recovery needed vs the ablation and that plan would be to do it vaginally and leave her ovaries and she could go home the same day. Standard surgical risks d/w her re: both options.  I told her that if for endometrial ablation pre op endometrial biopsy is recommended but patient declines today due to prior experience due to stenosis.   Pt to let 2022 know which treatment option she'd like to pursue.   RTC PRN  [place] MD Attending Center for Cornelia Copa [facility])

## 2021-01-17 DIAGNOSIS — F411 Generalized anxiety disorder: Secondary | ICD-10-CM | POA: Diagnosis not present

## 2021-01-17 DIAGNOSIS — F330 Major depressive disorder, recurrent, mild: Secondary | ICD-10-CM | POA: Diagnosis not present

## 2021-01-17 DIAGNOSIS — F439 Reaction to severe stress, unspecified: Secondary | ICD-10-CM | POA: Diagnosis not present

## 2021-01-19 ENCOUNTER — Emergency Department (HOSPITAL_COMMUNITY)
Admission: EM | Admit: 2021-01-19 | Discharge: 2021-01-19 | Disposition: A | Discharge status: Home / Self Care | Payer: Medicaid Other | Attending: Emergency Medicine | Admitting: Emergency Medicine

## 2021-01-19 ENCOUNTER — Encounter (HOSPITAL_COMMUNITY): Payer: Self-pay

## 2021-01-19 ENCOUNTER — Other Ambulatory Visit: Payer: Self-pay

## 2021-01-19 DIAGNOSIS — R109 Unspecified abdominal pain: Secondary | ICD-10-CM | POA: Diagnosis not present

## 2021-01-19 DIAGNOSIS — Z5321 Procedure and treatment not carried out due to patient leaving prior to being seen by health care provider: Secondary | ICD-10-CM | POA: Diagnosis not present

## 2021-01-19 NOTE — ED Notes (Signed)
Lab attempted to draw blood around 2030 and just now- pt not in WR

## 2021-01-19 NOTE — ED Notes (Signed)
Pt not answering and is not in WR

## 2021-01-19 NOTE — ED Triage Notes (Signed)
Pt presents to ED with abd pain that started today, denies constipation, diarrhea, nausea or vomiting. Pt reports one loose stool today.

## 2021-01-20 ENCOUNTER — Telehealth: Payer: Self-pay

## 2021-01-20 NOTE — Telephone Encounter (Signed)
Transition Care Management Unsuccessful Follow-up Telephone Call  Date of discharge and from where:  01/19/2021-[facility]  Attempts:  1st Attempt  Reason for unsuccessful TCM follow-up call:  Left voice message

## 2021-01-20 NOTE — Telephone Encounter (Signed)
Transition Care Management Follow-up Telephone Call Date of discharge and from where: 01/19/2021-[facility] How have you been since you were released from the hospital? [facility]patient stated she was seen at another facility but is doing fine.  Any questions or concerns? No  Items Reviewed: Did the pt receive and understand the discharge instructions provided?  [facility] Medications obtained and verified? No  Other? No  Any new allergies since your discharge? No  Dietary orders reviewed? N/A Do you have support at home? Yes   Home Care and Equipment/Supplies: Were home health services ordered? not applicable If so, what is the name of the agency? N/A  Has the agency set up a time to come to the patient's home? not applicable Were any new equipment or medical supplies ordered?  No What is the name of the medical supply agency? N/A Were you able to get the supplies/equipment? not applicable Do you have any questions related to the use of the equipment or supplies? No  Functional Questionnaire: (I = Independent and D = Dependent) ADLs: I  Bathing/Dressing- I  Meal Prep- I  Eating- I  Maintaining continence- I  Transferring/Ambulation- I  Managing Meds- I  Follow up appointments reviewed:  PCP Hospital f/u appt confirmed? No   Specialist Hospital f/u appt confirmed? No   Are transportation arrangements needed? No  If their condition worsens, is the pt aware to call PCP or go to the Emergency Dept.? Yes Was the patient provided with contact information for the PCP's office or ED? Yes Was to pt encouraged to call back with questions or concerns? Yes

## 2021-01-23 ENCOUNTER — Other Ambulatory Visit: Payer: Self-pay | Admitting: Obstetrics and Gynecology

## 2021-02-17 ENCOUNTER — Other Ambulatory Visit: Payer: Self-pay | Admitting: *Deleted

## 2021-02-17 NOTE — Telephone Encounter (Signed)
erroneous

## 2021-02-21 DIAGNOSIS — F411 Generalized anxiety disorder: Secondary | ICD-10-CM | POA: Diagnosis not present

## 2021-02-21 DIAGNOSIS — F330 Major depressive disorder, recurrent, mild: Secondary | ICD-10-CM | POA: Diagnosis not present

## 2021-03-16 ENCOUNTER — Encounter: Payer: Self-pay | Admitting: Radiology

## 2021-04-24 DIAGNOSIS — F330 Major depressive disorder, recurrent, mild: Secondary | ICD-10-CM | POA: Diagnosis not present

## 2021-04-24 DIAGNOSIS — F411 Generalized anxiety disorder: Secondary | ICD-10-CM | POA: Diagnosis not present

## 2021-05-15 ENCOUNTER — Other Ambulatory Visit: Payer: Self-pay | Admitting: Obstetrics and Gynecology

## 2021-06-06 ENCOUNTER — Encounter: Payer: Self-pay | Admitting: Obstetrics and Gynecology

## 2021-06-19 ENCOUNTER — Other Ambulatory Visit: Payer: Self-pay | Admitting: *Deleted

## 2021-06-19 MED ORDER — NORGESTIMATE-ETH ESTRADIOL 0.25-35 MG-MCG PO TABS: 1.0000 | ORAL_TABLET | Freq: Every day | ORAL | 3 refills | Status: DC

## 2021-06-26 DIAGNOSIS — F330 Major depressive disorder, recurrent, mild: Secondary | ICD-10-CM | POA: Diagnosis not present

## 2021-06-26 DIAGNOSIS — F411 Generalized anxiety disorder: Secondary | ICD-10-CM | POA: Diagnosis not present

## 2021-08-09 IMAGING — US US PELVIS COMPLETE WITH TRANSVAGINAL
2 series · 13 of 25 positions shown · non-contrast
Comparison: 05/12/2020

CLINICAL DATA: Abnormal uterine bleeding.  Recent IUD placement.

EXAM:
TRANSABDOMINAL AND TRANSVAGINAL ULTRASOUND OF PELVIS
TECHNIQUE: Both transabdominal and transvaginal ultrasound examinations of the
pelvis were performed. Transabdominal technique was performed for
global imaging of the pelvis including uterus, ovaries, adnexal
regions, and pelvic cul-de-sac. It was necessary to proceed with
endovaginal exam following the transabdominal exam to visualize the
IUD and ovaries.

[Series 1: us pelvic complete with transvaginal · 12 of 115 slices shown]
[im 1/115]
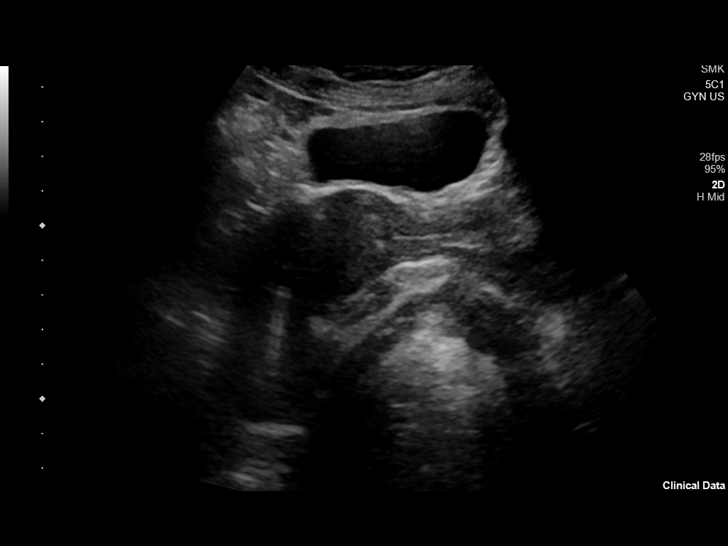
[im 10/115]
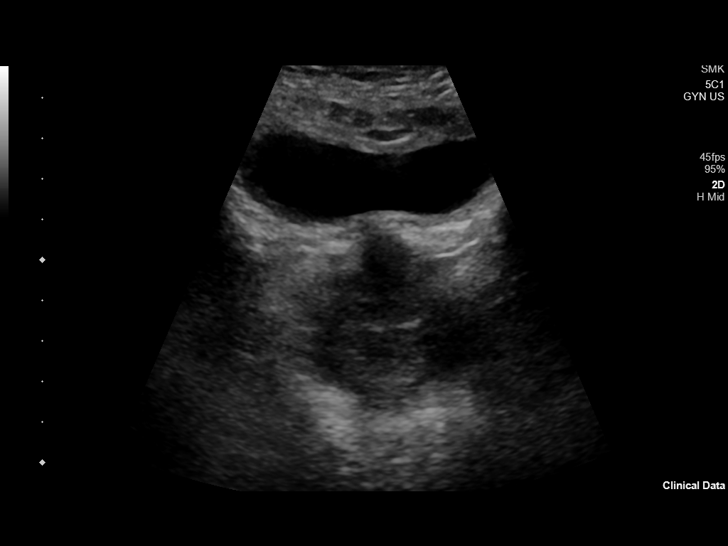
[im 20/115]
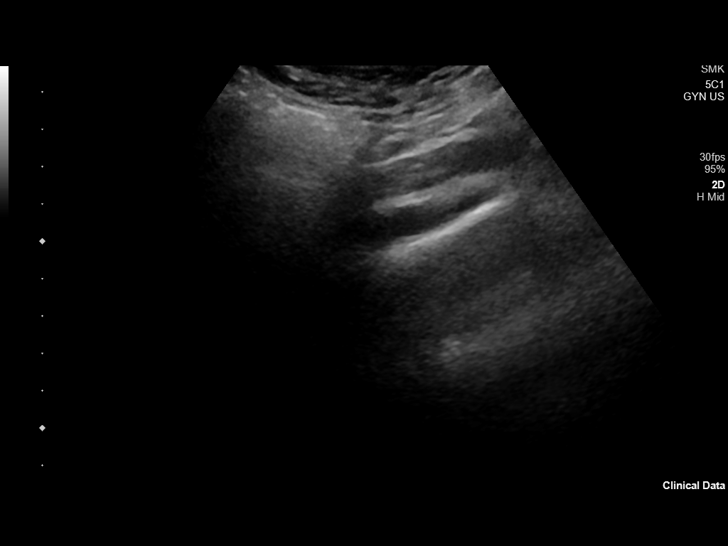
[im 30/115]
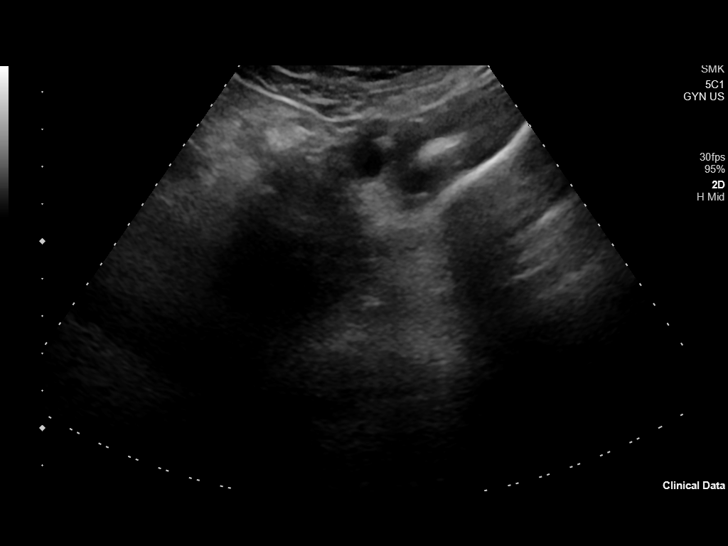
[im 40/115]
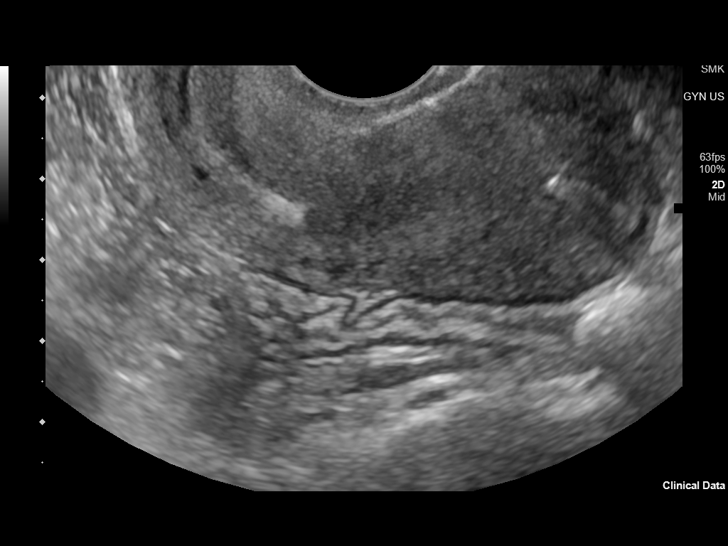
[im 50/115]
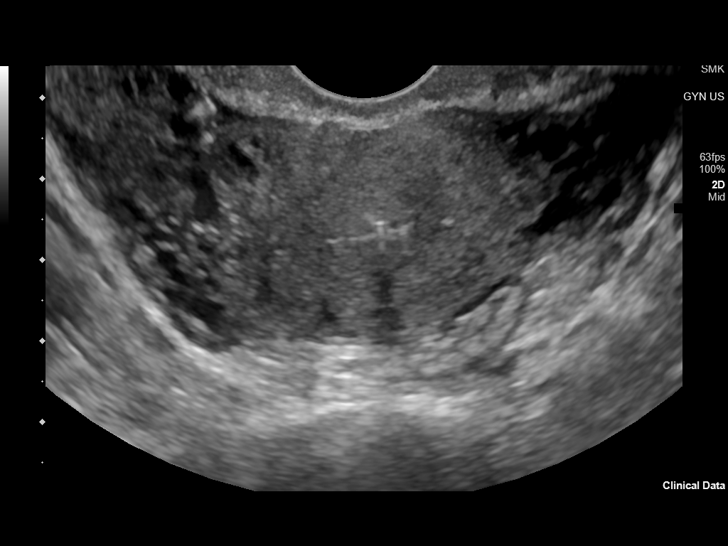
[im 60/115]
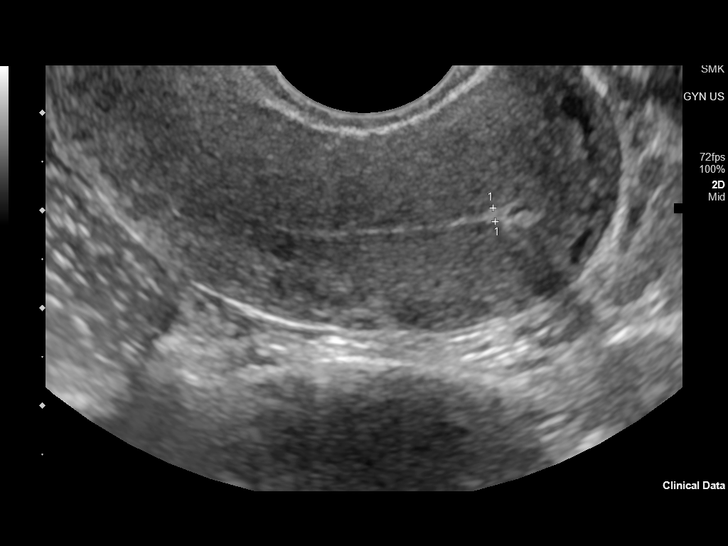
[im 70/115]
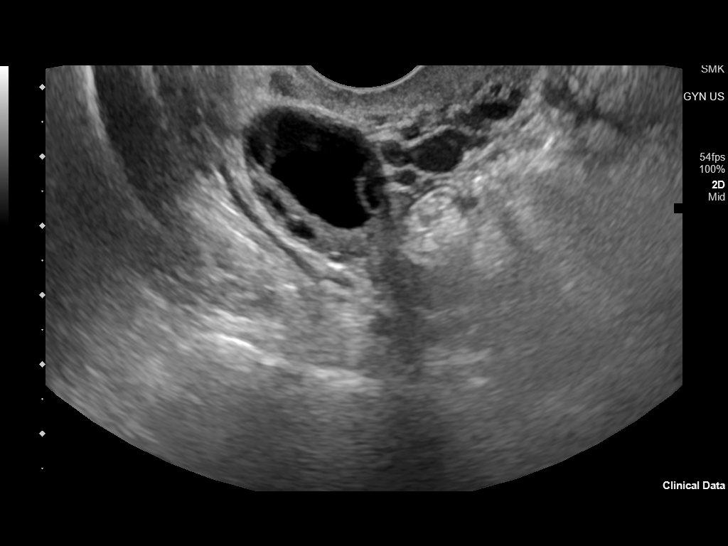
[im 80/115]
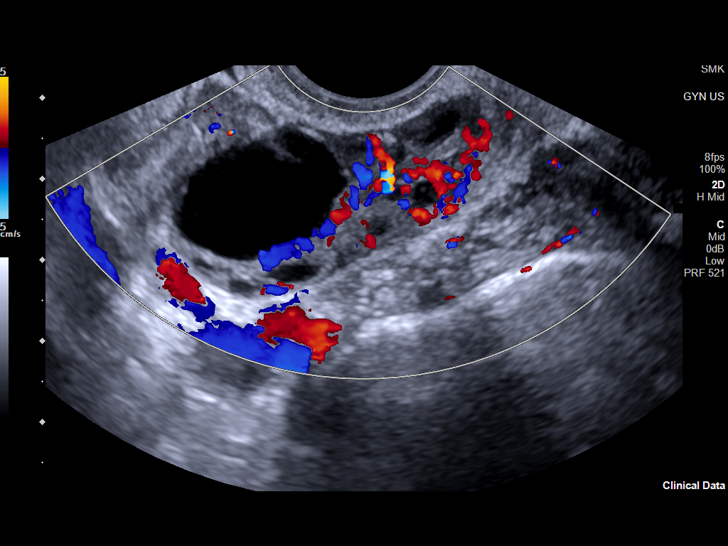
[im 90/115]
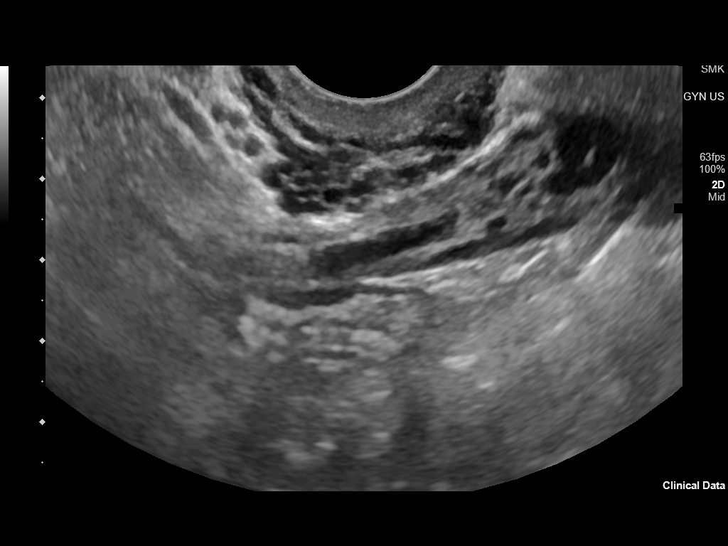
[im 100/115]
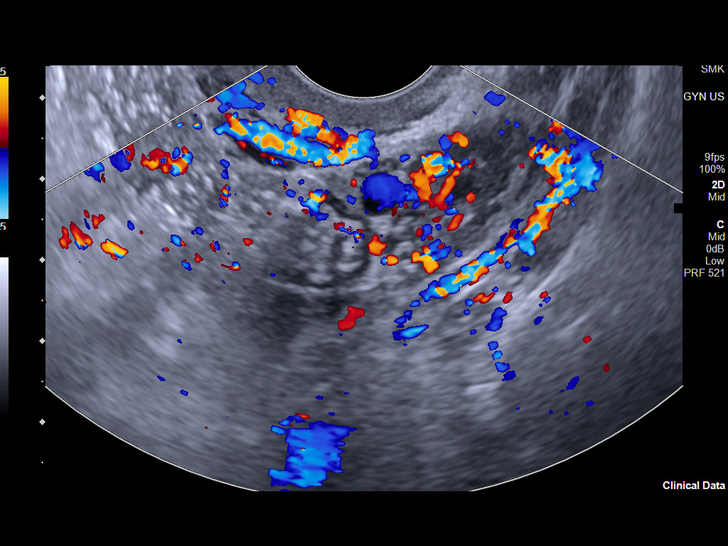
[im 110/115]
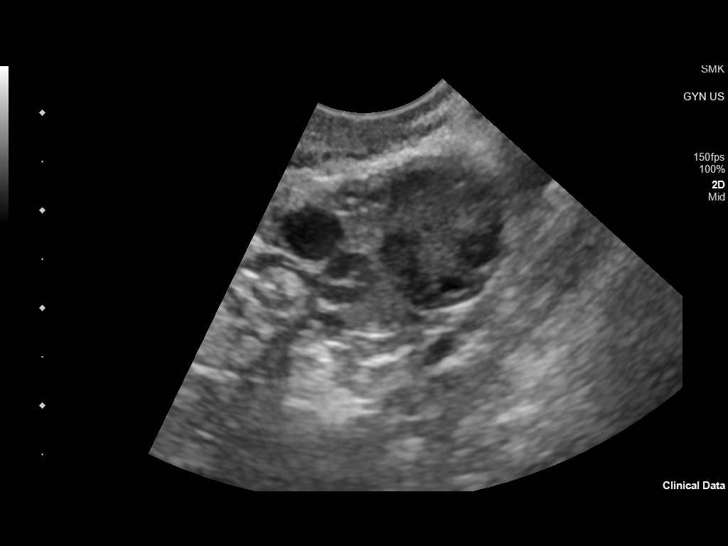

[Series 1001: gyn us · 1 of 1 slices shown]
[im 1/1]
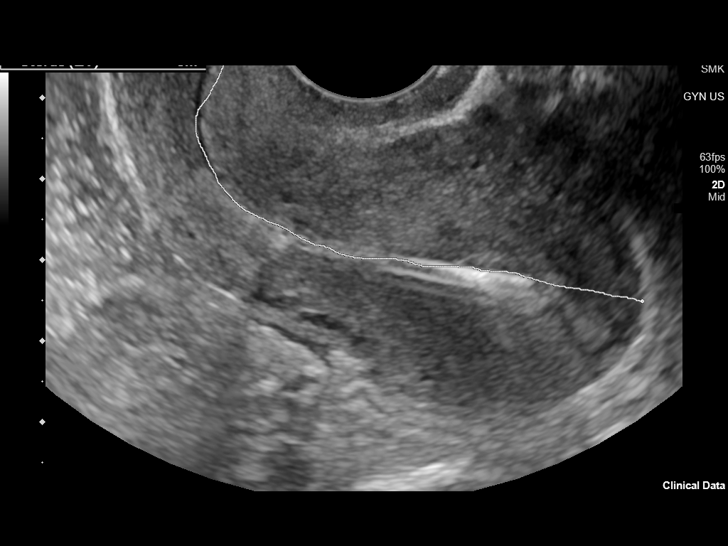

[13 of 25 positions shown; findings below may reference images not displayed]

FINDINGS: Uterus

Measurements: 8.5 x 3.5 x 4.8 cm = volume: 74 mL. Retroverted. No
fibroids or other mass visualized.

Endometrium

Thickness: 2 mm. IUD is seen in expected position within the
endometrial cavity. The right side arm of the IUD appears to
penetrate into the adjacent myometrium in the right fundal region.

Right ovary

Measurements: 4.4 x 2.0 x 2.2 cm = volume: 10.1 mL. Normal
appearance/no adnexal mass.

Left ovary

Measurements: 3.8 x 1.6 x 1.7 cm = volume: 5.4 mL. Normal
appearance/no adnexal mass.

Other findings

No abnormal free fluid.
IMPRESSION: IUD within endometrial cavity. The right side arm appears to
penetrate into the adjacent myometrium in the right fundal region.

Retroverted uterus. No evidence of fibroids or abnormal endometrial
thickening.

Normal appearance of both ovaries.

## 2021-08-21 DIAGNOSIS — F411 Generalized anxiety disorder: Secondary | ICD-10-CM | POA: Diagnosis not present

## 2021-08-21 DIAGNOSIS — F330 Major depressive disorder, recurrent, mild: Secondary | ICD-10-CM | POA: Diagnosis not present

## 2021-09-11 DIAGNOSIS — F411 Generalized anxiety disorder: Secondary | ICD-10-CM | POA: Diagnosis not present

## 2021-09-11 DIAGNOSIS — F330 Major depressive disorder, recurrent, mild: Secondary | ICD-10-CM | POA: Diagnosis not present

## 2021-10-13 DIAGNOSIS — F411 Generalized anxiety disorder: Secondary | ICD-10-CM | POA: Diagnosis not present

## 2021-10-13 DIAGNOSIS — F330 Major depressive disorder, recurrent, mild: Secondary | ICD-10-CM | POA: Diagnosis not present

## 2021-10-25 DIAGNOSIS — F411 Generalized anxiety disorder: Secondary | ICD-10-CM | POA: Diagnosis not present

## 2021-11-20 DIAGNOSIS — F329 Major depressive disorder, single episode, unspecified: Secondary | ICD-10-CM | POA: Diagnosis not present

## 2021-11-20 DIAGNOSIS — F411 Generalized anxiety disorder: Secondary | ICD-10-CM | POA: Diagnosis not present

## 2021-12-05 DIAGNOSIS — Z111 Encounter for screening for respiratory tuberculosis: Secondary | ICD-10-CM | POA: Diagnosis not present

## 2021-12-12 DIAGNOSIS — Z23 Encounter for immunization: Secondary | ICD-10-CM | POA: Diagnosis not present

## 2021-12-12 DIAGNOSIS — K219 Gastro-esophageal reflux disease without esophagitis: Secondary | ICD-10-CM | POA: Diagnosis not present

## 2021-12-12 DIAGNOSIS — Z20820 Contact with and (suspected) exposure to varicella: Secondary | ICD-10-CM | POA: Diagnosis not present

## 2021-12-19 DIAGNOSIS — F329 Major depressive disorder, single episode, unspecified: Secondary | ICD-10-CM | POA: Diagnosis not present

## 2021-12-19 DIAGNOSIS — F411 Generalized anxiety disorder: Secondary | ICD-10-CM | POA: Diagnosis not present

## 2022-01-09 DIAGNOSIS — Z23 Encounter for immunization: Secondary | ICD-10-CM | POA: Diagnosis not present

## 2022-01-16 DIAGNOSIS — F329 Major depressive disorder, single episode, unspecified: Secondary | ICD-10-CM | POA: Diagnosis not present

## 2022-01-16 DIAGNOSIS — F411 Generalized anxiety disorder: Secondary | ICD-10-CM | POA: Diagnosis not present

## 2022-02-13 DIAGNOSIS — F329 Major depressive disorder, single episode, unspecified: Secondary | ICD-10-CM | POA: Diagnosis not present

## 2022-02-13 DIAGNOSIS — F411 Generalized anxiety disorder: Secondary | ICD-10-CM | POA: Diagnosis not present

## 2022-04-18 DIAGNOSIS — N952 Postmenopausal atrophic vaginitis: Secondary | ICD-10-CM | POA: Diagnosis not present

## 2022-04-18 DIAGNOSIS — N898 Other specified noninflammatory disorders of vagina: Secondary | ICD-10-CM | POA: Diagnosis not present

## 2022-04-25 DIAGNOSIS — F411 Generalized anxiety disorder: Secondary | ICD-10-CM | POA: Diagnosis not present

## 2022-04-25 DIAGNOSIS — F329 Major depressive disorder, single episode, unspecified: Secondary | ICD-10-CM | POA: Diagnosis not present

## 2022-05-04 ENCOUNTER — Telehealth: Payer: Self-pay | Admitting: Internal Medicine

## 2022-05-04 ENCOUNTER — Ambulatory Visit: Payer: Medicaid Other | Attending: Internal Medicine

## 2022-05-04 ENCOUNTER — Ambulatory Visit: Payer: Medicaid Other | Attending: Internal Medicine | Admitting: Internal Medicine

## 2022-05-04 ENCOUNTER — Encounter: Payer: Self-pay | Admitting: Internal Medicine

## 2022-05-04 VITALS — BP 106/70 | HR 71 | Ht 59.0 in | Wt 102.2 lb

## 2022-05-04 DIAGNOSIS — R002 Palpitations: Secondary | ICD-10-CM

## 2022-05-04 DIAGNOSIS — R42 Dizziness and giddiness: Secondary | ICD-10-CM | POA: Diagnosis not present

## 2022-05-04 DIAGNOSIS — R55 Syncope and collapse: Secondary | ICD-10-CM

## 2022-05-04 NOTE — Progress Notes (Signed)
Work note sent via Clinical cytogeneticist to pt.

## 2022-05-04 NOTE — Patient Instructions (Signed)
Medication Instructions:  Your physician recommends that you continue on your current medications as directed. Please refer to the Current Medication list given to you today.   *If you need a refill on your cardiac medications before your next appointment, please call your pharmacy*   Lab Work: NONE ordered at this time of appointment   If you have labs (blood work) drawn today and your tests are completely normal, you will receive your results only by: [place] (if you have MyChart) OR A paper copy in the mail If you have any lab test that is abnormal or we need to change your treatment, we will call you to review the results.   Testing/Procedures: Your physician has requested that you have an echocardiogram. Echocardiography is a painless test that uses sound waves to create images of your heart. It provides your doctor with information about the size and shape of your heart and how well your heart's chambers and valves are working. This procedure takes approximately one hour. There are no restrictions for this procedure. Please do NOT wear cologne, perfume, aftershave, or lotions (deodorant is allowed). Please arrive 15 minutes prior to your appointment time.   ZIO AT Long term monitor Telemetry  Your physician has requested you wear a ZIO patch monitor for 7 days.  This is a single patch monitor. [employer] supplies one patch monitor per enrollment. Additional  stickers are not available.  Please do not apply patch if you will be having a Nuclear Stress Test, Echocardiogram, Cardiac CT, MRI,  or Chest Xray during the period you would be wearing the monitor. The patch cannot be worn during  these tests. You cannot remove and re-apply the ZIO AT patch monitor.  Your ZIO patch monitor will be mailed 3 day USPS to your address on file. It may take 3-5 days to  receive your monitor after you have been enrolled.  Once you have received your monitor, please review the enclosed  instructions. Your monitor has  already been registered assigning a specific monitor serial # to you.   Billing and Patient Assistance Program information  Sue Shepherd has been supplied with any insurance information on record for billing. [employer] offers a sliding scale Patient Assistance Program for patients without insurance, or whose  insurance does not completely cover the cost of the ZIO patch monitor. You must apply for the  Patient Assistance Program to qualify for the discounted rate. To apply, call [employer] at [phone],  select option 4, select option 2 , ask to apply for the Patient Assistance Program, (you can request an  interpreter if needed). [employer] will ask your household income and how many people are in your  household. [employer] will quote your out-of-pocket cost based on this information. They will also be able  to set up a 12 month interest free payment plan if needed.  Applying the monitor   Shave hair from upper left chest.  Hold the abrader disc by orange tab. Rub the abrader in 40 strokes over left upper chest as indicated in  your monitor instructions.  Clean area with 4 enclosed alcohol pads. Use all pads to ensure the area is cleaned thoroughly. Let  dry.  Apply patch as indicated in monitor instructions. Patch will be placed under collarbone on left side of  chest with arrow pointing upward.  Rub patch adhesive wings for 2 minutes. Remove the white label marked "1". Remove the white label  marked "2". Rub patch adhesive wings for 2 additional minutes.  While looking in a mirror, press and release button in center of patch. A small green light will flash 3-4  times. This will be your only indicator that the monitor has been turned on.  Do not shower for the first 24 hours. You may shower after the first 24 hours.  Press the button if you feel a symptom. You will hear a small click. Record Date, Time and Symptom in  the Patient Log.   Starting the Gateway  In  your kit there is a Hydrographic surveyor box the size of a cellphone. This is Airline pilot. It transmits all your  recorded data to [facility]. This box must always stay within 10 feet of you. Open the box and push the *  button. There will be a light that blinks orange and then green a few times. When the light stops  blinking, the Gateway is connected to the ZIO patch. Call [employer] at [phone] to confirm your monitor is transmitting.  Returning your monitor  Remove your patch and place it inside the [place]. In the lower half of the Gateway there is a white  bag with prepaid postage on it. Place Gateway in bag and seal. Mail package back to [place] as soon as  possible. Your physician should have your final report approximately 7 days after you have mailed back  your monitor. Call [place] at [phone] if you have questions regarding your ZIO AT  patch monitor. Call them immediately if you see an orange light blinking on your monitor.  If your monitor falls off in less than 4 days, contact our Monitor department at [phone]. If your  monitor becomes loose or falls off after 4 days call [employer] at [phone] for suggestions on  securing your monitor     Follow-Up: At [facility], you and your health needs are our priority.  As part of our continuing mission to provide you with exceptional heart care, we have created designated Provider Care Teams.  These Care Teams include your primary Cardiologist (physician) and Advanced Practice Providers (APPs -  Physician Assistants and Nurse Practitioners) who all work together to provide you with the care you need, when you need it.  We recommend signing up for the patient portal called "MyChart".  Sign up information is provided on this After Visit Summary.  MyChart is used to connect with patients for Virtual Visits (Telemedicine).  Patients are able to view lab/test results, encounter notes, upcoming  appointments, etc.  Non-urgent messages can be sent to your provider as well.   To learn more about what you can do with MyChart, go to [email or web address].    Your next appointment:   3 month(s)  The format for your next appointment:   In Person  Provider:   Claudina Lick, MD    Other Instructions   Important Information About Sugar

## 2022-05-04 NOTE — Telephone Encounter (Signed)
Completed.

## 2022-05-04 NOTE — Progress Notes (Signed)
Cardiology Office Note  Date: 05/04/2022   ID: PRISCILLA KIRSTEIN, DOB [birth year], MRN [id number]  PCP:  Patient, No Pcp Per  Cardiologist:  None Electrophysiologist:  None   Reason for Office Visit: Syncope evaluation at the request of Dr. Register   History of Present Illness: TRAYCE CARAVELLO is a 31 y.o. female known to have depression/anxiety was referred to cardiology clinic for evaluation of syncope.  Patient was having syncopal episodes since she was 31 years old, occurs once in a few months, with warning signs of nervousness/dizziness/palpitations/blurry vision followed by falling on the ground with vision blacking out but patient still was able to hear. No LOC. She gets up from the floor whenever her vision is regained and has no confusion afterwards. Patient was dealing with these presyncopal episodes since she was 31 years old but her psychiatrist referred her to cardiology for further evaluation of these episodes.  In between the presyncopal episodes, patient denied any symptoms of dizziness, lightness, syncope but has frequent palpitations (few times per week) which she got used to them.  Last presyncopal episode was while standing in her job.she was not stressed out, had a prodrome of dizziness followed by falling on the ground with vision blackout  (still able to hear ) and woke up after a few minutes.  Blood pressure and heart rate were checked at that time, blood pressure was found to be 88 SBP, HR was found to be 120 bpm.  She has a family history of premature ASCVD (her father had PCI at the age of 76). No family history of early deaths. Denied shortness of breath, alcohol use and illicit drug abuse.  Past Medical History:  Diagnosis Date   Anxiety    Depression    Lyme disease    Malpositioned intrauterine device 07/13/2020    Past Surgical History:  Procedure Laterality Date   IUD REMOVAL  07/19/2020       LAPAROSCOPIC TUBAL LIGATION Bilateral 09/14/2020   Procedure:  LAPAROSCOPIC TUBAL LIGATION;  Surgeon: Oakwood Bing, MD;  Location: [facility];  Service: Gynecology;  Laterality: Bilateral;   WISDOM TOOTH EXTRACTION      Current Outpatient Medications  Medication Sig Dispense Refill   FLUoxetine (PROZAC) 10 MG capsule 1 capsule Orally Once a day for 30 days     No current facility-administered medications for this visit.   Allergies:  Patient has no known allergies.   Social History: The patient  reports that she quit smoking about 4 years ago. Her smoking use included cigarettes. She has never used smokeless tobacco. She reports that she does not currently use alcohol. She reports current drug use. Drug: Marijuana.   Family History: The patient's family history includes Anxiety disorder in her paternal grandfather and sister; Arthritis in her paternal grandfather; Crohn's disease in her paternal uncle; Dementia in her paternal grandmother; Depression in her sister; Diabetes in her maternal grandfather; Heart disease in her paternal grandfather.   ROS:  Please see the history of present illness. Otherwise, complete review of systems is positive for none.  All other systems are reviewed and negative.   Physical Exam: VS:  BP 106/70   Pulse 71   Ht 4\' 11"  (1.499 m)   Wt 102 lb 3.2 oz (46.4 kg)   SpO2 97%   BMI 20.64 kg/m , BMI Body mass index is 20.64 kg/m.  Wt Readings from Last 3 Encounters:  05/04/22 102 lb 3.2 oz (46.4 kg)  01/19/21 100 lb  2 oz (45.4 kg)  01/10/21 101 lb 12.8 oz (46.2 kg)    General: Patient appears comfortable at rest. HEENT: Conjunctiva and lids normal, oropharynx clear with moist mucosa. Neck: Supple, no elevated JVP or carotid bruits, no thyromegaly. Lungs: Clear to auscultation, nonlabored breathing at rest. Cardiac: Regular rate and rhythm, no S3 or significant systolic murmur, no pericardial rub. Abdomen: Soft, nontender, no hepatomegaly, bowel sounds present, no guarding or rebound. Extremities:  No pitting edema, distal pulses 2+. Skin: Warm and dry. Musculoskeletal: No kyphosis. Neuropsychiatric: Alert and oriented x3, affect grossly appropriate.  ECG:  An ECG dated 05/04/22 was personally reviewed today and demonstrated:  NSR and no ST-T changes  Recent Labwork: No results found for requested labs within last 365 days.  No results found for: "CHOL", "TRIG", "HDL", "CHOLHDL", "VLDL", "LDLCALC", "LDLDIRECT"  Other Studies Reviewed Today:   Assessment and Plan: Patient is a 31 year old F known to have depression/anxiety was referred to cardiology clinic for evaluation of presyncope.  # Presyncope # Palpitations -Orthostatic vitals today negative for orthostatic tach hypotension and POTS. -Patient has been having presyncopal episodes since she was 31 years old, occurring once in a few months and has no LOC. Does not sound cardiac however will need to rule out cardiac conditions. EKG showed normal sinus rhythm, no preexcitation and no ST-T changes. Obtain 1 week event monitor and 2D echocardiogram.  I have spent a total of 41 minutes with patient reviewing chart, EKGs, labs and examining patient as well as establishing an assessment and plan that was discussed with the patient.  > 50% of time was spent in direct patient care.     Medication Adjustments/Labs and Tests Ordered: Current medicines are reviewed at length with the patient today.  Concerns regarding medicines are outlined above.   Tests Ordered: Orders Placed This Encounter  Procedures   LONG TERM MONITOR (3-14 DAYS)   EKG 12-Lead   ECHOCARDIOGRAM COMPLETE    Medication Changes: No orders of the defined types were placed in this encounter.   Disposition:  Follow up  3 months  Signed, Roselyn Doby Verne Spurr, MD, 05/04/2022 12:39 PM    [facility] at [facility] [address] [place], [place] [address]

## 2022-05-04 NOTE — Telephone Encounter (Signed)
Pt needs a doctors from appt today and would like for it to be uploaded in mychart if possible. Please advise

## 2022-05-14 ENCOUNTER — Encounter: Payer: Self-pay | Admitting: Advanced Practice Midwife

## 2022-05-14 ENCOUNTER — Ambulatory Visit: Payer: Medicaid Other | Admitting: Advanced Practice Midwife

## 2022-05-14 VITALS — BP 86/57 | HR 73 | Ht 59.0 in | Wt 105.0 lb

## 2022-05-14 DIAGNOSIS — N898 Other specified noninflammatory disorders of vagina: Secondary | ICD-10-CM | POA: Diagnosis not present

## 2022-05-14 DIAGNOSIS — F520 Hypoactive sexual desire disorder: Secondary | ICD-10-CM

## 2022-05-14 DIAGNOSIS — N941 Unspecified dyspareunia: Secondary | ICD-10-CM

## 2022-05-14 MED ORDER — ADDYI 100 MG PO TABS: 1.0000 | ORAL_TABLET | Freq: Every day | ORAL | 12 refills | Status: AC

## 2022-05-14 MED ORDER — ESTRADIOL 0.1 MG/GM VA CREA: TOPICAL_CREAM | VAGINAL | 2 refills | Status: AC

## 2022-05-14 NOTE — Patient Instructions (Addendum)
Use the vaginal moisturizer 3 times a week during the day and the estrogen cream every night at bedtime. Insert after intercourse, if applicable.   Use estrogen cream every night for 1 month, then decrease to 3 times a week for another month.   [email or web address] for information about the medicine.  The prescription was sent to [facility]  [phone].  They should contact you this week.  If not, please call them.

## 2022-05-14 NOTE — Progress Notes (Signed)
[facility] Visit  Patient name: Sue Shepherd MRN [id number]  Date of birth: 1990/09/23  CC & HPI:  Sue Shepherd is a 32 y.o. Caucasian female presenting today for vaginal dryness and associated dyspareunia, low libido. In monogamous relationship with a partner that she enjoys.  Used to desire sexual relations, but over the past year or so, no longer wants it.  She had still been having regular (2-3 times a week) intercourse, able to climax and enjoyed it once getting into it.  However, over the past month or so, has noticed vaginal dryness and sex has become painful, therefore less frequent.  Uses a lubricant, but still feels dry. Partner is aware and understanding.  Pertinent History Reviewed:  Medical & Surgical Hx:   Past Medical History:  Diagnosis Date   Anxiety    Depression    Lyme disease    Malpositioned intrauterine device 07/13/2020   Past Surgical History:  Procedure Laterality Date   IUD REMOVAL  07/19/2020       LAPAROSCOPIC TUBAL LIGATION Bilateral 09/14/2020   Procedure: LAPAROSCOPIC TUBAL LIGATION;  Surgeon: [place] Bing, MD;  Location: [facility];  Service: Gynecology;  Laterality: Bilateral;   WISDOM TOOTH EXTRACTION     Family History  Problem Relation Age of Onset   Arthritis Paternal Grandfather    Anxiety disorder Paternal Grandfather    Heart disease Paternal Grandfather    Anxiety disorder Sister    Depression Sister    Crohn's disease Paternal Uncle    Diabetes Maternal Grandfather    Dementia Paternal Grandmother     Current Outpatient Medications:    estradiol (ESTRACE VAGINAL) 0.1 MG/GM vaginal cream, Use estrogen cream every night for 1 month, then decrease to 3 times a week for another month., Disp: 42.5 g, Rfl: 2   Flibanserin (ADDYI) 100 MG TABS, Take 1 tablet by mouth at bedtime., Disp: 30 tablet, Rfl: 12   FLUoxetine (PROZAC) 10 MG capsule, 1 capsule Orally Once a day for 30 days, Disp: , Rfl:  Social  History: Reviewed -  reports that she quit smoking about 4 years ago. Her smoking use included cigarettes. She has never used smokeless tobacco.  Review of Systems:   Constitutional: Negative for fever and chills Eyes: Negative for visual disturbances Respiratory: Negative for shortness of breath, dyspnea Cardiovascular: Negative for chest pain or palpitations  Gastrointestinal: Negative for vomiting, diarrhea and constipation; no abdominal pain Genitourinary: Negative for dysuria and urgency, vaginal irritation or itching Musculoskeletal: Negative for back pain, joint pain, myalgias  Neurological: Negative for dizziness and headaches    Objective Findings:    Physical Examination: Vitals:   05/14/22 1538  BP: (!) 86/57  Pulse: 73   General appearance - well appearing, and in no distress Mental status - alert, oriented to person, place, and time Chest:  Normal respiratory effort Heart - normal rate and regular rhythm Abdomen:  Soft, nontender Pelvic: SSE:  vagina pink w/good rugae. 1cm Area at introitus that is a smidge darker than surrounding tissues, but non-tender.  Q-tip test negative Musculoskeletal:  Normal range of motion without pain Extremities:  No edema    No results found for this or any previous visit (from the past 24 hour(s)).    Assessment & Plan:  A:   Vaginal dryness, possibly d/t COC use  Dyspareunia r/t vaginal dryness  Hypoactive sexual desire disorder P:  Use a vaginal moisturizer 3x/week  Meds ordered this encounter  Medications  Flibanserin (ADDYI) 100 MG TABS    Sig: Take 1 tablet by mouth at bedtime.    Dispense:  30 tablet    Refill:  12    Order Specific Question:   Supervising Provider    Answer:   Tania Ade H [[id number]]   estradiol (ESTRACE VAGINAL) 0.1 MG/GM vaginal cream    Sig: Use estrogen cream every night for 1 month, then decrease to 3 times a week for another month.    Dispense:  42.5 g    Refill:  2    Order Specific  Question:   Supervising Provider    Answer:   Tania Ade H [[id number]]   Warnings about Addyi and near syncope discussed. Pt doesn't drink ETOH, plans to take at night, aware of potential SE.  Addyi sent to [facility] for biggest savings.   F/U 6 weeks   Return in about 6 weeks (around 06/25/2022) for GYN visit.  Christin Fudge CNM 05/14/2022 7:17 PM

## 2022-05-16 ENCOUNTER — Encounter: Payer: Self-pay | Admitting: Advanced Practice Midwife

## 2022-05-18 ENCOUNTER — Ambulatory Visit (HOSPITAL_COMMUNITY)
Admission: RE | Admit: 2022-05-18 | Discharge: 2022-05-18 | Disposition: A | Discharge status: Home / Self Care | Payer: Medicaid Other | Source: Ambulatory Visit | Attending: Internal Medicine | Admitting: Internal Medicine

## 2022-05-18 DIAGNOSIS — R55 Syncope and collapse: Secondary | ICD-10-CM | POA: Diagnosis not present

## 2022-05-18 LAB — ECHOCARDIOGRAM COMPLETE
Area-P 1/2: 3.21 cm2
S' Lateral: 3.3 cm

## 2022-05-18 NOTE — Progress Notes (Signed)
*  PRELIMINARY RESULTS* Echocardiogram 2D Echocardiogram has been performed.  Sue Shepherd 05/18/2022, 4:04 PM

## 2022-05-21 ENCOUNTER — Telehealth: Payer: Self-pay

## 2022-05-21 NOTE — Telephone Encounter (Signed)
-----  Message from Chalmers Guest, MD sent at 05/20/2022  7:44 AM EST ----- Normal Echo

## 2022-05-21 NOTE — Telephone Encounter (Signed)
Sent message via mychart

## 2022-05-22 ENCOUNTER — Telehealth: Payer: Self-pay | Admitting: *Deleted

## 2022-05-22 NOTE — Telephone Encounter (Signed)
Pt's insurance has denied covering Estradiol 0.01% cream. Pt needs to try and fail on 2 formulary preferred drugs such as Estring vaginal ring, Premarin vaginal cream or Vagifem vaginal tablet. Please advise. Thanks! [place]

## 2022-05-24 ENCOUNTER — Other Ambulatory Visit: Payer: Self-pay | Admitting: Advanced Practice Midwife

## 2022-05-24 MED ORDER — ESTROGENS CONJUGATED 0.625 MG/GM VA CREA: TOPICAL_CREAM | VAGINAL | 1 refills | Status: AC

## 2022-05-24 NOTE — Progress Notes (Signed)
Insurance prefers premarin, rx sent

## 2022-05-26 ENCOUNTER — Other Ambulatory Visit: Payer: Self-pay | Admitting: Obstetrics and Gynecology

## 2022-06-25 ENCOUNTER — Ambulatory Visit: Payer: 59 | Admitting: Advanced Practice Midwife

## 2022-06-26 DIAGNOSIS — F329 Major depressive disorder, single episode, unspecified: Secondary | ICD-10-CM | POA: Diagnosis not present

## 2022-06-26 DIAGNOSIS — F411 Generalized anxiety disorder: Secondary | ICD-10-CM | POA: Diagnosis not present

## 2022-07-23 ENCOUNTER — Encounter: Payer: Self-pay | Admitting: Advanced Practice Midwife

## 2022-07-23 ENCOUNTER — Ambulatory Visit: Payer: Medicaid Other | Admitting: Advanced Practice Midwife

## 2022-07-23 VITALS — BP 90/61 | HR 83 | Ht 59.0 in | Wt 105.0 lb

## 2022-07-23 DIAGNOSIS — N941 Unspecified dyspareunia: Secondary | ICD-10-CM | POA: Diagnosis not present

## 2022-07-23 DIAGNOSIS — F520 Hypoactive sexual desire disorder: Secondary | ICD-10-CM

## 2022-07-23 DIAGNOSIS — N898 Other specified noninflammatory disorders of vagina: Secondary | ICD-10-CM

## 2022-07-23 NOTE — Progress Notes (Signed)
[facility] Visit  Patient name: Sue Shepherd MRN [id number]  Date of birth: 1990-11-27   CC & HPI:  KAMELLA GROSHANS is a 32 y.o.  female presenting today for medication f/u.  Started on vaginal estrogen 2 months ago for vaginal dryness/dyspareunia.  Also Started on Addyi, Feels like both medications have helped!  Still w/some mild dyspareunia, but improved.  Addyi has definitely helped, no unpleasant SE.   Pertinent History Reviewed:  Medical & Surgical Hx:   Past Medical History:  Diagnosis Date   Anxiety    Depression    Lyme disease    Malpositioned intrauterine device 07/13/2020   Past Surgical History:  Procedure Laterality Date   IUD REMOVAL  07/19/2020       LAPAROSCOPIC TUBAL LIGATION Bilateral 09/14/2020   Procedure: LAPAROSCOPIC TUBAL LIGATION;  Surgeon: Aletha Halim, MD;  Location: [facility];  Service: Gynecology;  Laterality: Bilateral;   WISDOM TOOTH EXTRACTION     Family History  Problem Relation Age of Onset   Arthritis Paternal Grandfather    Anxiety disorder Paternal Grandfather    Heart disease Paternal Grandfather    Anxiety disorder Sister    Depression Sister    Crohn's disease Paternal Uncle    Diabetes Maternal Grandfather    Dementia Paternal Grandmother     Current Outpatient Medications:    conjugated estrogens (PREMARIN) vaginal cream, Qhs for 1 month, then q 3 days for another month, Disp: 42.5 g, Rfl: 1   estradiol (ESTRACE VAGINAL) 0.1 MG/GM vaginal cream, Use estrogen cream every night for 1 month, then decrease to 3 times a week for another month., Disp: 42.5 g, Rfl: 2   Flibanserin (ADDYI) 100 MG TABS, Take 1 tablet by mouth at bedtime., Disp: 30 tablet, Rfl: 12   FLUoxetine (PROZAC) 10 MG capsule, 1 capsule Orally Once a day for 30 days (Patient not taking: Reported on 07/23/2022), Disp: , Rfl:  Social History: Reviewed -  reports that she quit smoking about 4 years ago. Her smoking use included cigarettes.  She has never used smokeless tobacco.  Review of Systems:   Constitutional: Negative for fever and chills Eyes: Negative for visual disturbances Respiratory: Negative for shortness of breath, dyspnea Cardiovascular: Negative for chest pain or palpitations  Gastrointestinal: Negative for vomiting, diarrhea and constipation; no abdominal pain Genitourinary: Negative for dysuria and urgency, vaginal irritation or itching Musculoskeletal: Negative for back pain, joint pain, myalgias  Neurological: Negative for dizziness and headaches    Objective Findings:    Physical Examination: Vitals:   07/23/22 1449  BP: 90/61  Pulse: 83   General appearance - well appearing, and in no distress Mental status - alert, oriented to person, place, and time Chest:  Normal respiratory effort Heart - normal rate and regular rhythm Abdomen:  Soft, nontender Musculoskeletal:  Normal range of motion without pain Extremities:  No edema    No results found for this or any previous visit (from the past 24 hour(s)).    Assessment & Plan:  A:   Continue twice weekly vaginal estrogen for 2 months, then decrease to once a week P:  Will touch base in 3 months to see how the 1/week regimen is working.  Messages scheduled to go out via mychart.    No follow-ups on file.  Christin Fudge CNM 07/23/2022 3:02 PM

## 2022-08-15 ENCOUNTER — Ambulatory Visit: Payer: Medicaid Other | Attending: Internal Medicine | Admitting: Internal Medicine

## 2022-08-15 NOTE — Progress Notes (Signed)
Erroneous encounter - please disregard.

## 2022-08-17 ENCOUNTER — Encounter: Payer: Self-pay | Admitting: Internal Medicine

## 2024-03-16 DIAGNOSIS — G44319 Acute post-traumatic headache, not intractable: Secondary | ICD-10-CM | POA: Diagnosis not present

## 2024-03-16 DIAGNOSIS — L568 Other specified acute skin changes due to ultraviolet radiation: Secondary | ICD-10-CM | POA: Diagnosis not present

## 2024-03-16 DIAGNOSIS — R531 Weakness: Secondary | ICD-10-CM | POA: Diagnosis not present

## 2024-03-16 DIAGNOSIS — M542 Cervicalgia: Secondary | ICD-10-CM | POA: Diagnosis not present

## 2024-03-16 DIAGNOSIS — R292 Abnormal reflex: Secondary | ICD-10-CM | POA: Diagnosis not present

## 2024-03-17 DIAGNOSIS — R531 Weakness: Secondary | ICD-10-CM | POA: Diagnosis not present

## 2024-03-17 DIAGNOSIS — R112 Nausea with vomiting, unspecified: Secondary | ICD-10-CM | POA: Diagnosis not present

## 2024-03-17 DIAGNOSIS — L568 Other specified acute skin changes due to ultraviolet radiation: Secondary | ICD-10-CM | POA: Diagnosis not present

## 2024-03-17 DIAGNOSIS — M542 Cervicalgia: Secondary | ICD-10-CM | POA: Diagnosis not present

## 2024-03-17 DIAGNOSIS — R220 Localized swelling, mass and lump, head: Secondary | ICD-10-CM | POA: Diagnosis not present

## 2024-03-17 DIAGNOSIS — G44319 Acute post-traumatic headache, not intractable: Secondary | ICD-10-CM | POA: Diagnosis not present

## 2024-03-17 DIAGNOSIS — R292 Abnormal reflex: Secondary | ICD-10-CM | POA: Diagnosis not present

## 2024-03-17 DIAGNOSIS — R519 Headache, unspecified: Secondary | ICD-10-CM | POA: Diagnosis not present

## 2024-03-18 DIAGNOSIS — L568 Other specified acute skin changes due to ultraviolet radiation: Secondary | ICD-10-CM | POA: Diagnosis not present

## 2024-03-18 DIAGNOSIS — R292 Abnormal reflex: Secondary | ICD-10-CM | POA: Diagnosis not present

## 2024-03-18 DIAGNOSIS — M542 Cervicalgia: Secondary | ICD-10-CM | POA: Diagnosis not present

## 2024-03-18 DIAGNOSIS — R112 Nausea with vomiting, unspecified: Secondary | ICD-10-CM | POA: Diagnosis not present

## 2024-03-18 DIAGNOSIS — G44319 Acute post-traumatic headache, not intractable: Secondary | ICD-10-CM | POA: Diagnosis not present

## 2024-03-18 DIAGNOSIS — R531 Weakness: Secondary | ICD-10-CM | POA: Diagnosis not present
